# Patient Record
Sex: Female | Born: 1955 | Race: White | Hispanic: No | Marital: Married | Smoking: Never smoker
Health system: Southern US, Community
[De-identification: ages and names within clinical notes are randomized; demographics above are authoritative.]

---

## 1998-04-28 ENCOUNTER — Other Ambulatory Visit: Admission: RE | Admit: 1998-04-28 | Discharge: 1998-04-28 | Payer: Self-pay | Admitting: *Deleted

## 1999-08-03 ENCOUNTER — Encounter: Admission: RE | Admit: 1999-08-03 | Discharge: 1999-08-03 | Payer: Self-pay | Admitting: *Deleted

## 1999-08-14 ENCOUNTER — Other Ambulatory Visit: Admission: RE | Admit: 1999-08-14 | Discharge: 1999-08-14 | Payer: Self-pay | Admitting: *Deleted

## 2001-01-14 ENCOUNTER — Other Ambulatory Visit: Admission: RE | Admit: 2001-01-14 | Discharge: 2001-01-14 | Payer: Self-pay | Admitting: *Deleted

## 2001-01-30 ENCOUNTER — Encounter: Admission: RE | Admit: 2001-01-30 | Discharge: 2001-01-30 | Payer: Self-pay | Admitting: *Deleted

## 2001-02-06 ENCOUNTER — Encounter: Admission: RE | Admit: 2001-02-06 | Discharge: 2001-02-06 | Payer: Self-pay | Admitting: *Deleted

## 2002-01-26 ENCOUNTER — Other Ambulatory Visit: Admission: RE | Admit: 2002-01-26 | Discharge: 2002-01-26 | Payer: Self-pay | Admitting: *Deleted

## 2003-02-10 ENCOUNTER — Other Ambulatory Visit: Admission: RE | Admit: 2003-02-10 | Discharge: 2003-02-10 | Payer: Self-pay | Admitting: *Deleted

## 2005-01-29 ENCOUNTER — Ambulatory Visit: Payer: Self-pay | Admitting: General Practice

## 2006-02-12 ENCOUNTER — Ambulatory Visit: Payer: Self-pay | Admitting: General Practice

## 2007-02-17 ENCOUNTER — Ambulatory Visit: Payer: Self-pay | Admitting: General Practice

## 2008-02-23 ENCOUNTER — Ambulatory Visit: Payer: Self-pay | Admitting: General Practice

## 2009-02-28 ENCOUNTER — Ambulatory Visit: Payer: Self-pay | Admitting: General Practice

## 2009-03-03 ENCOUNTER — Ambulatory Visit: Payer: Self-pay | Admitting: General Practice

## 2010-02-20 ENCOUNTER — Ambulatory Visit: Payer: Self-pay | Admitting: General Practice

## 2011-02-19 ENCOUNTER — Ambulatory Visit: Payer: Self-pay

## 2011-02-26 ENCOUNTER — Ambulatory Visit: Payer: Self-pay | Admitting: General Practice

## 2012-02-18 ENCOUNTER — Ambulatory Visit: Payer: Self-pay

## 2012-02-21 ENCOUNTER — Ambulatory Visit: Payer: Self-pay

## 2013-02-24 ENCOUNTER — Ambulatory Visit: Payer: Self-pay | Admitting: General Practice

## 2014-02-22 ENCOUNTER — Ambulatory Visit: Payer: Self-pay | Admitting: General Practice

## 2014-03-07 ENCOUNTER — Ambulatory Visit: Payer: Self-pay | Admitting: General Practice

## 2015-01-27 ENCOUNTER — Other Ambulatory Visit: Payer: Self-pay | Admitting: Family Medicine

## 2015-01-27 ENCOUNTER — Other Ambulatory Visit: Payer: Self-pay

## 2015-01-28 LAB — CMP12+LP+TP+TSH+6AC+CBC/D/PLT
ALT: 30 IU/L (ref 0–32)
AST: 25 IU/L (ref 0–40)
Albumin/Globulin Ratio: 1.6 (ref 1.1–2.5)
Albumin: 4.2 g/dL (ref 3.5–5.5)
Alkaline Phosphatase: 111 IU/L (ref 39–117)
BUN/Creatinine Ratio: 13 (ref 9–23)
BUN: 9 mg/dL (ref 6–24)
Basophils Absolute: 0.1 10*3/uL (ref 0.0–0.2)
Basos: 1 %
Bilirubin Total: 0.5 mg/dL (ref 0.0–1.2)
Calcium: 8.9 mg/dL (ref 8.7–10.2)
Chloride: 104 mmol/L (ref 97–108)
Chol/HDL Ratio: 4.7 ratio units — ABNORMAL HIGH (ref 0.0–4.4)
Cholesterol, Total: 173 mg/dL (ref 100–199)
Creatinine, Ser: 0.69 mg/dL (ref 0.57–1.00)
EOS (ABSOLUTE): 0.2 10*3/uL (ref 0.0–0.4)
Eos: 2 %
Estimated CHD Risk: 1.2 times avg. — ABNORMAL HIGH (ref 0.0–1.0)
Free Thyroxine Index: 2.2 (ref 1.2–4.9)
GFR calc Af Amer: 112 mL/min/{1.73_m2} (ref >59)
GFR calc non Af Amer: 97 mL/min/{1.73_m2} (ref >59)
GGT: 36 IU/L (ref 0–60)
Globulin, Total: 2.6 g/dL (ref 1.5–4.5)
Glucose: 100 mg/dL — ABNORMAL HIGH (ref 65–99)
HDL: 37 mg/dL — ABNORMAL LOW (ref >39)
Hematocrit: 40.4 % (ref 34.0–46.6)
Hemoglobin: 13.7 g/dL (ref 11.1–15.9)
Immature Grans (Abs): 0 10*3/uL (ref 0.0–0.1)
Immature Granulocytes: 0 %
Iron: 86 ug/dL (ref 27–159)
LDH: 193 IU/L (ref 119–226)
LDL Calculated: 115 mg/dL — ABNORMAL HIGH (ref 0–99)
Lymphocytes Absolute: 2.7 10*3/uL (ref 0.7–3.1)
Lymphs: 37 %
MCH: 31.6 pg (ref 26.6–33.0)
MCHC: 33.9 g/dL (ref 31.5–35.7)
MCV: 93 fL (ref 79–97)
Monocytes Absolute: 0.6 10*3/uL (ref 0.1–0.9)
Monocytes: 8 %
Neutrophils Absolute: 3.8 10*3/uL (ref 1.4–7.0)
Neutrophils: 52 %
Phosphorus: 3 mg/dL (ref 2.5–4.5)
Platelets: 320 10*3/uL (ref 150–379)
Potassium: 4.3 mmol/L (ref 3.5–5.2)
RBC: 4.34 x10E6/uL (ref 3.77–5.28)
RDW: 13.5 % (ref 12.3–15.4)
Sodium: 140 mmol/L (ref 134–144)
T3 Uptake Ratio: 26 % (ref 24–39)
T4, Total: 8.6 ug/dL (ref 4.5–12.0)
TSH: 1.08 u[IU]/mL (ref 0.450–4.500)
Total Protein: 6.8 g/dL (ref 6.0–8.5)
Triglycerides: 105 mg/dL (ref 0–149)
Uric Acid: 5.2 mg/dL (ref 2.5–7.1)
VLDL Cholesterol Cal: 21 mg/dL (ref 5–40)
WBC: 7.4 10*3/uL (ref 3.4–10.8)

## 2015-01-28 LAB — HGB A1C W/O EAG: Hgb A1c MFr Bld: 5.7 % — ABNORMAL HIGH (ref 4.8–5.6)

## 2015-12-26 ENCOUNTER — Other Ambulatory Visit: Payer: Self-pay | Admitting: Family Medicine

## 2015-12-27 LAB — CMP12+LP+TP+TSH+6AC+CBC/D/PLT
ALK PHOS: 185 IU/L — AB (ref 39–117)
ALT: 532 IU/L (ref 0–32)
AST: 261 IU/L — AB (ref 0–40)
Albumin/Globulin Ratio: 1.4 (ref 1.2–2.2)
Albumin: 4.5 g/dL (ref 3.5–5.5)
BASOS: 1 %
BILIRUBIN TOTAL: 0.8 mg/dL (ref 0.0–1.2)
BUN / CREAT RATIO: 15 (ref 9–23)
BUN: 11 mg/dL (ref 6–24)
Basophils Absolute: 0 10*3/uL (ref 0.0–0.2)
CHLORIDE: 102 mmol/L (ref 96–106)
CREATININE: 0.72 mg/dL (ref 0.57–1.00)
Calcium: 9.1 mg/dL (ref 8.7–10.2)
Chol/HDL Ratio: 4.3 ratio units (ref 0.0–4.4)
Cholesterol, Total: 192 mg/dL (ref 100–199)
EOS (ABSOLUTE): 0.2 10*3/uL (ref 0.0–0.4)
EOS: 3 %
ESTIMATED CHD RISK: 1 times avg. (ref 0.0–1.0)
Free Thyroxine Index: 1.6 (ref 1.2–4.9)
GFR calc Af Amer: 107 mL/min/{1.73_m2} (ref >59)
GFR, EST NON AFRICAN AMERICAN: 93 mL/min/{1.73_m2} (ref >59)
GGT: 529 IU/L — ABNORMAL HIGH (ref 0–60)
GLUCOSE: 89 mg/dL (ref 65–99)
Globulin, Total: 3.3 g/dL (ref 1.5–4.5)
HDL: 45 mg/dL (ref >39)
HEMATOCRIT: 45.4 % (ref 34.0–46.6)
HEMOGLOBIN: 15.2 g/dL (ref 11.1–15.9)
IRON: 227 ug/dL — AB (ref 27–159)
Immature Grans (Abs): 0 10*3/uL (ref 0.0–0.1)
Immature Granulocytes: 0 %
LDH: 314 IU/L — ABNORMAL HIGH (ref 119–226)
LDL CALC: 116 mg/dL — AB (ref 0–99)
LYMPHS ABS: 2.4 10*3/uL (ref 0.7–3.1)
Lymphs: 31 %
MCH: 31 pg (ref 26.6–33.0)
MCHC: 33.5 g/dL (ref 31.5–35.7)
MCV: 93 fL (ref 79–97)
Monocytes Absolute: 0.4 10*3/uL (ref 0.1–0.9)
Monocytes: 6 %
Neutrophils Absolute: 4.6 10*3/uL (ref 1.4–7.0)
Neutrophils: 59 %
Phosphorus: 2.8 mg/dL (ref 2.5–4.5)
Platelets: 371 10*3/uL (ref 150–379)
Potassium: 4.3 mmol/L (ref 3.5–5.2)
RBC: 4.9 x10E6/uL (ref 3.77–5.28)
RDW: 13.4 % (ref 12.3–15.4)
SODIUM: 140 mmol/L (ref 134–144)
T3 Uptake Ratio: 22 % — ABNORMAL LOW (ref 24–39)
T4, Total: 7.4 ug/dL (ref 4.5–12.0)
TSH: 1.65 u[IU]/mL (ref 0.450–4.500)
Total Protein: 7.8 g/dL (ref 6.0–8.5)
Triglycerides: 157 mg/dL — ABNORMAL HIGH (ref 0–149)
URIC ACID: 5 mg/dL (ref 2.5–7.1)
VLDL CHOLESTEROL CAL: 31 mg/dL (ref 5–40)
WBC: 7.7 10*3/uL (ref 3.4–10.8)

## 2015-12-27 LAB — HGB A1C W/O EAG: HEMOGLOBIN A1C: 5.4 % (ref 4.8–5.6)

## 2015-12-27 LAB — VITAMIN D 25 HYDROXY (VIT D DEFICIENCY, FRACTURES): VIT D 25 HYDROXY: 27.3 ng/mL — AB (ref 30.0–100.0)

## 2016-01-02 ENCOUNTER — Other Ambulatory Visit: Payer: Self-pay | Admitting: Family Medicine

## 2016-01-03 LAB — HEPATIC FUNCTION PANEL
ALT: 72 IU/L — AB (ref 0–32)
AST: 27 IU/L (ref 0–40)
Albumin: 4.6 g/dL (ref 3.5–5.5)
Alkaline Phosphatase: 140 IU/L — ABNORMAL HIGH (ref 39–117)
BILIRUBIN TOTAL: 0.4 mg/dL (ref 0.0–1.2)
BILIRUBIN, DIRECT: 0.13 mg/dL (ref 0.00–0.40)
Total Protein: 7.7 g/dL (ref 6.0–8.5)

## 2016-01-05 ENCOUNTER — Other Ambulatory Visit: Payer: Self-pay | Admitting: Family Medicine

## 2016-01-05 DIAGNOSIS — R7401 Elevation of levels of liver transaminase levels: Secondary | ICD-10-CM

## 2016-01-05 DIAGNOSIS — R740 Nonspecific elevation of levels of transaminase and lactic acid dehydrogenase [LDH]: Principal | ICD-10-CM

## 2016-01-16 ENCOUNTER — Ambulatory Visit
Admission: RE | Admit: 2016-01-16 | Discharge: 2016-01-16 | Disposition: A | Discharge status: Home / Self Care | Payer: No Typology Code available for payment source | Source: Ambulatory Visit | Attending: Family Medicine | Admitting: Family Medicine

## 2016-01-16 DIAGNOSIS — R740 Nonspecific elevation of levels of transaminase and lactic acid dehydrogenase [LDH]: Principal | ICD-10-CM

## 2016-01-16 DIAGNOSIS — R7401 Elevation of levels of liver transaminase levels: Secondary | ICD-10-CM

## 2016-02-20 IMAGING — MG MAM DGTL ADD VW RT SCR
5 series · 5 of 5 positions shown · non-contrast
Comparison: Prior exams

CLINICAL DATA: Call back from screening for bilateral breast
abnormalities. Possible mass noted on the right with calcifications
noted on the left.

EXAM:
DIGITAL DIAGNOSTIC  BILATERAL MAMMOGRAM
ULTRASOUND BILATERAL BREAST

[L ML]
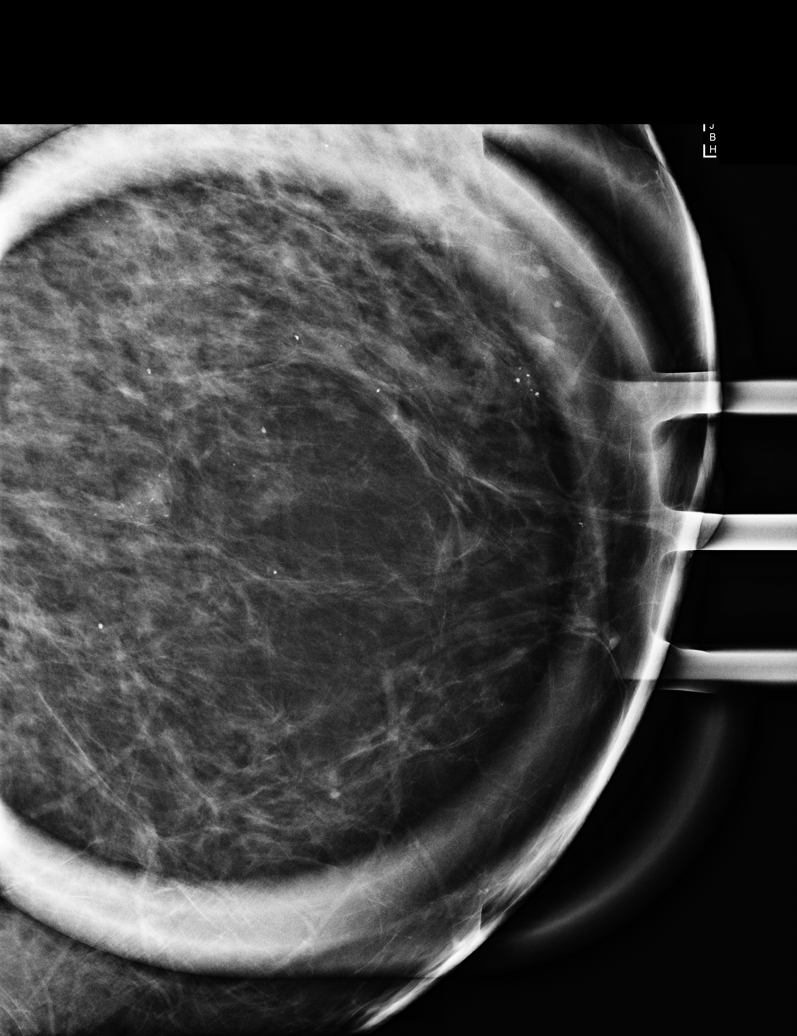

[R MLO]
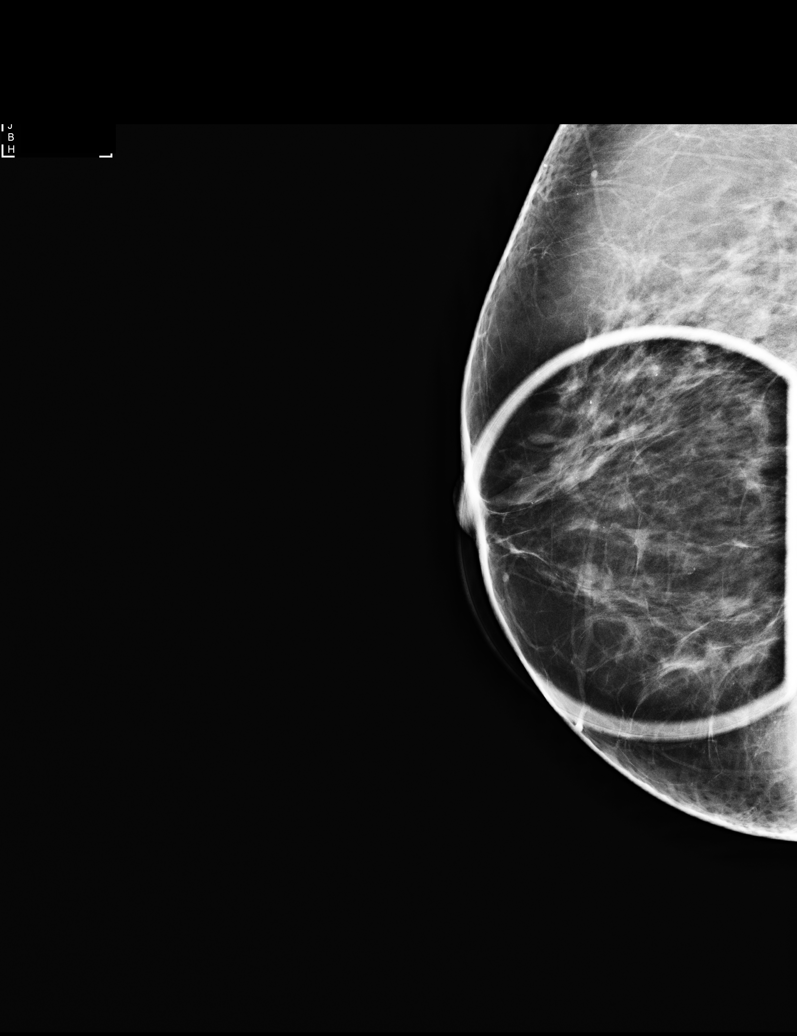

[R CC (1 of 2)]
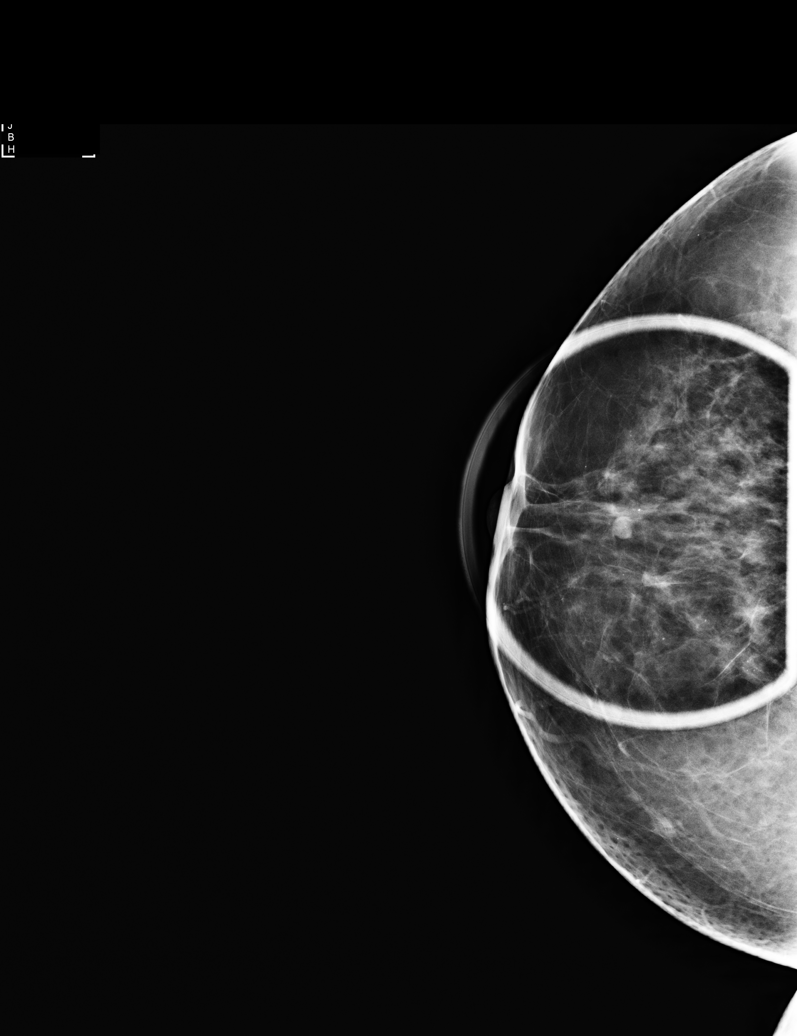

[R CC (2 of 2)]
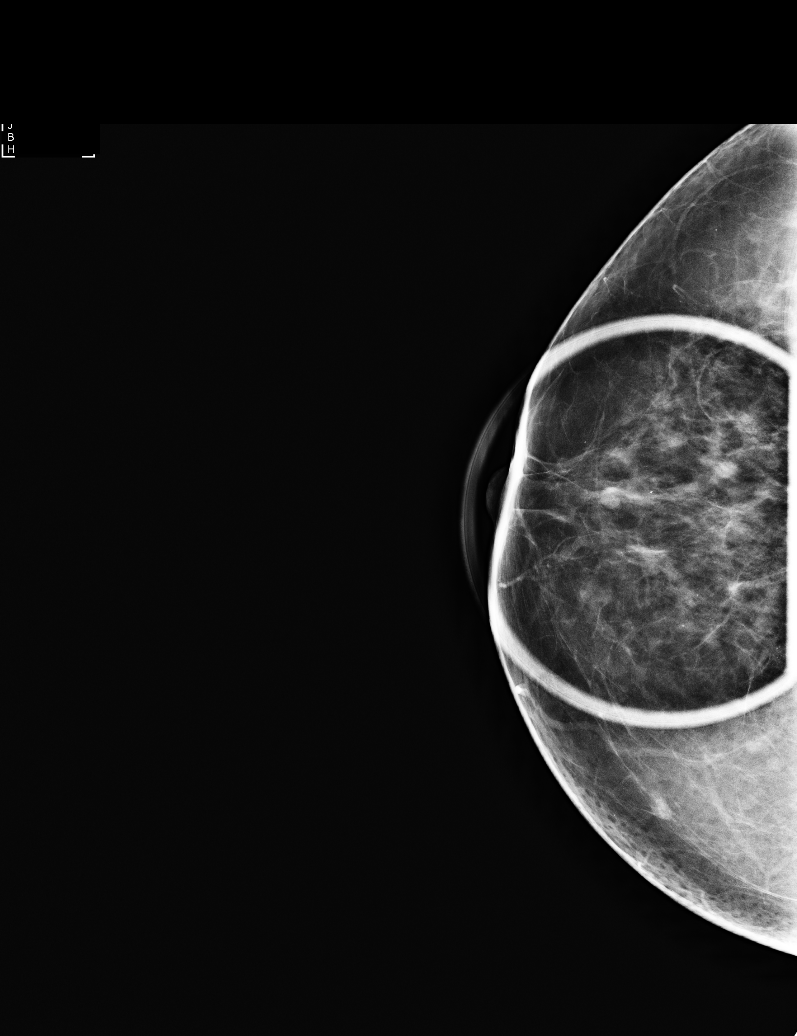

[L CC]
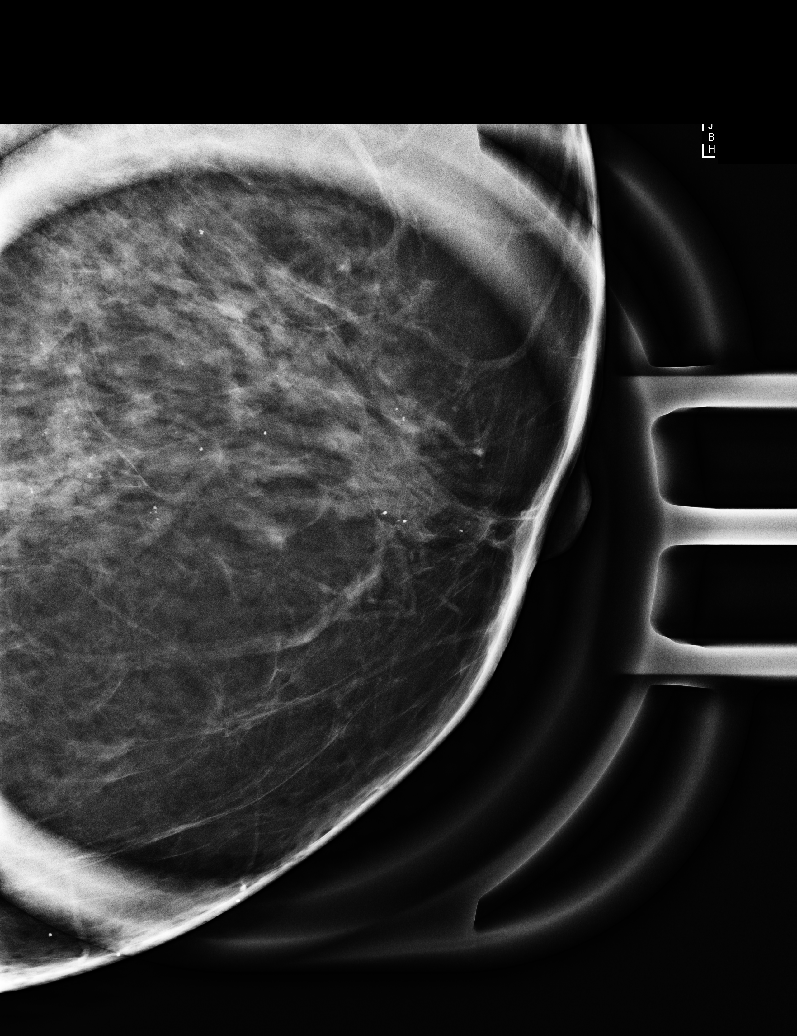

[5 of 5 positions shown; findings below may reference images not displayed]

ACR Breast Density Category b: There are scattered areas of
fibroglandular density.
FINDINGS: On the right, there is a small mass with a possible associated
peripheral calcification. This may be new from prior studies. It
persists on spot compression imaging.

On the left, mildly pleomorphic calcifications are noted medially,
which appear increased from the prior study. There is no linearity
or branching. There is focal associated opacity but no defined mass.

Ultrasound is performed on the right, showing small cyst measuring
4.6 mm x 3.5 mm x 4.2 mm. This is consistent with the mammographic
abnormality.

Sonographic evaluation of the left breast, throughout the medial
left breast, shows no masses or suspicious findings.
IMPRESSION: 1. Benign right breast cyst. No evidence of right breast malignancy.
2. Probably benign left breast calcifications. Short-term follow-up
recommended.

RECOMMENDATION:
1. Short-term followup with repeat left breast diagnostic
mammography in 6 months.
2. Routine yearly screening mammography for the right breast

I have discussed the findings and recommendations with the patient.
Results were also provided in writing at the conclusion of the
visit. If applicable, a reminder letter will be sent to the patient
regarding the next appointment.

BI-RADS CATEGORY  3: Probably benign.

## 2016-09-12 ENCOUNTER — Ambulatory Visit: Payer: Self-pay | Admitting: *Deleted

## 2016-09-12 DIAGNOSIS — S91311S Laceration without foreign body, right foot, sequela: Secondary | ICD-10-CM

## 2016-09-12 NOTE — Progress Notes (Signed)
Pt has laceration to top of R foot. Cut it on a ceramic planter. Seen in [place] ED. Received sutures and a Tdap vaccine. Refused work note from ED. Cannot fit foot in shoe today 2/2 swelling and tenderness. 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 Requests wound cleaning and redressing as she was instructed to clean it daily.  Cleaned with first aid antiseptic. Triple antibiotic ointment placed on site. Covered with telfa, gauze and foot wrapped in [place]. Supplies for next few days of dressing changes provided to pt. Has appt with PCP tomorrow for evaluation and work notes. Plans to return to work Monday. Pt reports no questions or concerns.

## 2016-11-26 ENCOUNTER — Ambulatory Visit: Payer: Self-pay | Admitting: Registered Nurse

## 2016-11-26 VITALS — BP 124/82 | HR 64 | Temp 97.3°F

## 2016-11-26 DIAGNOSIS — H6593 Unspecified nonsuppurative otitis media, bilateral: Secondary | ICD-10-CM

## 2016-11-26 DIAGNOSIS — H1031 Unspecified acute conjunctivitis, right eye: Secondary | ICD-10-CM

## 2016-11-26 DIAGNOSIS — H1013 Acute atopic conjunctivitis, bilateral: Secondary | ICD-10-CM

## 2016-11-26 MED ORDER — ERYTHROMYCIN 5 MG/GM OP OINT: 1.0000 "application " | TOPICAL_OINTMENT | Freq: Four times a day (QID) | OPHTHALMIC | 0 refills | Status: AC

## 2016-11-26 MED ORDER — CARBOXYMETHYLCELLULOSE SODIUM 1 % OP SOLN: 1.0000 [drp] | Freq: Three times a day (TID) | OPHTHALMIC | 12 refills | Status: DC

## 2016-11-26 MED ORDER — KETOTIFEN FUMARATE 0.025 % OP SOLN: 1.0000 [drp] | Freq: Two times a day (BID) | OPHTHALMIC | 0 refills | Status: DC

## 2016-11-26 NOTE — Patient Instructions (Signed)
Bacterial Conjunctivitis Bacterial conjunctivitis is an infection of the clear membrane that covers the white part of your eye and the inner surface of your eyelid (conjunctiva). When the blood vessels in your conjunctiva become inflamed, your eye becomes red or pink, and it will probably feel itchy. Bacterial conjunctivitis spreads very easily from person to person (is contagious). It also spreads easily from one eye to the other eye. What are the causes? This condition is caused by several common bacteria. You may get the infection if you come into close contact with another person who is infected. You may also come into contact with items that are contaminated with the bacteria, such as a face towel, contact lens solution, or eye makeup. What increases the risk? This condition is more likely to develop in people who:  Are exposed to other people who have the infection.  Wear contact lenses.  Have a sinus infection.  Have had a recent eye injury or surgery.  Have a weak body defense system (immune system).  Have a medical condition that causes dry eyes.  What are the signs or symptoms? Symptoms of this condition include:  Eye redness.  Tearing or watery eyes.  Itchy eyes.  Burning feeling in your eyes.  Thick, yellowish discharge from an eye. This may turn into a crust on the eyelid overnight and cause your eyelids to stick together.  Swollen eyelids.  Blurred vision.  How is this diagnosed? Your health care provider can diagnose this condition based on your symptoms and medical history. Your health care provider may also take a sample of discharge from your eye to find the cause of your infection. This is rarely done. How is this treated? Treatment for this condition includes:  Antibiotic eye drops or ointment to clear the infection more quickly and prevent the spread of infection to others.  Oral antibiotic medicines to treat infections that do not respond to drops or  ointments, or last longer than 10 days.  Cool, wet cloths (cool compresses) placed on the eyes.  Artificial tears applied 2-6 times a day.  Follow these instructions at home: Medicines  Take or apply your antibiotic medicine as told by your health care provider. Do not stop taking or applying the antibiotic even if you start to feel better.  Take or apply over-the-counter and prescription medicines only as told by your health care provider.  Be very careful to avoid touching the edge of your eyelid with the eye drop bottle or the ointment tube when you apply medicines to the affected eye. This will keep you from spreading the infection to your other eye or to other people. Managing discomfort  Gently wipe away any drainage from your eye with a warm, wet washcloth or a cotton ball.  Apply a cool, clean washcloth to your eye for 10-20 minutes, 3-4 times a day. General instructions  Do not wear contact lenses until the inflammation is gone and your health care provider says it is safe to wear them again. Ask your health care provider how to sterilize or replace your contact lenses before you use them again. Wear glasses until you can resume wearing contacts.  Avoid wearing eye makeup until the inflammation is gone. Throw away any old eye cosmetics that may be contaminated.  Change or wash your pillowcase every day.  Do not share towels or washcloths. This may spread the infection.  Wash your hands often with soap and water. Use paper towels to dry your hands.  Avoid   touching or rubbing your eyes.  Do not drive or use heavy machinery if your vision is blurred. Contact a health care provider if:  You have a fever.  Your symptoms do not get better after 10 days. Get help right away if:  You have a fever and your symptoms suddenly get worse.  You have severe pain when you move your eye.  You have facial pain, redness, or swelling.  You have sudden loss of vision. This  information is not intended to replace advice given to you by your health care provider. Make sure you discuss any questions you have with your health care provider. Document Released: 04/15/2005 Document Revised: 08/24/2015 Document Reviewed: 01/26/2015 Elsevier Interactive Patient Education  2017 [employer] Allergic Conjunctivitis, Adult Allergic conjunctivitis is inflammation of the clear membrane that covers the white part of your eye and the inner surface of your eyelid (conjunctiva). The inflammation is caused by allergies. The blood vessels in the conjunctiva become inflamed and this causes the eyes to become red or pink. The eyes often feel itchy. Allergic conjunctivitis cannot be spread from one person to another person (is not contagious). What are the causes? This condition is caused by an allergic reaction. Common causes of an allergic reaction (allergens) include:  Outdoor allergens, such as: ? Pollen. ? Grass and weeds. ? Mold spores.  Indoor allergens, such as: ? Dust. ? Smoke. ? Mold. ? Pet dander. ? Animal hair.  What increases the risk? You may be more likely to develop this condition if you have a family history of allergies, such as:  Allergic rhinitis.  Bronchial asthma.  Atopic dermatitis.  What are the signs or symptoms? Symptoms of this condition include eyes that are:  Itchy.  Red.  Watery.  Puffy.  Your eyes may also:  Sting or burn.  Have clear drainage coming from them.  How is this diagnosed? This condition may be diagnosed by medical history and physical exam. If you have drainage from your eyes, it may be tested to rule out other causes of conjunctivitis. You may also need to see a health care provider who specializes in treating allergies (allergist) or eye conditions (ophthalmologist) for tests to confirm the diagnosis. You may have:  Skin tests to see which allergens are causing your symptoms. These tests involve pricking the skin  with a tiny needle and exposing the skin to small amounts of potential allergens to see if your skin reacts.  Blood tests.  Tissue scrapings from your eyelid. These will be examined under a microscope.  How is this treated? Treatments for this condition may include:  Cold cloths (compresses) to soothe itching and swelling.  Washing the face to remove allergens.  Eye drops. These may be prescription or over-the-counter. There are several different types. You may need to try different types to see which one works best for you. Your may need: ? Eye drops that block the allergic reaction (antihistamine). ? Eye drops that reduce swelling and irritation (anti-inflammatory). ? Steroid eye drops to lessen a severe reaction (vernal conjunctivitis).  Oral antihistamine medicines to reduce your allergic reaction. You may need these if eye drops do not help or are difficult to use.  Follow these instructions at home:  Avoid known allergens whenever possible.  Take or apply over-the-counter and prescription medicines only as told by your health care provider. These include any eye drops.  Apply a cool, clean washcloth to your eye for 10-20 minutes, 3-4 times a day.  Do  not touch or rub your eyes.  Do not wear contact lenses until the inflammation is gone. Wear glasses instead.  Do not wear eye makeup until the inflammation is gone.  Keep all follow-up visits as told by your health care provider. This is important. Contact a health care provider if:  Your symptoms get worse or do not improve with treatment.  You have mild eye pain.  You have sensitivity to light.  You have spots or blisters on your eyes.  You have pus draining from your eye.  You have a fever. Get help right away if:  You have redness, swelling, or other symptoms in only one eye.  Your vision is blurred or you have vision changes.  You have severe eye pain. This information is not intended to replace advice  given to you by your health care provider. Make sure you discuss any questions you have with your health care provider. Document Released: 07/06/2002 Document Revised: 12/13/2015 Document Reviewed: 10/27/2015 Elsevier Interactive Patient Education  2018 [employer]

## 2016-11-26 NOTE — Progress Notes (Signed)
Subjective:    Patient ID: Teresa Mccormick, female    DOB: 01-Dec-1955, 61 y.o.   MRN: [id number]  61y/o caucasian female established Pt c/o L eye swelling, pain, pressure since yesterday. TTP below eye. Denies redness, foreign body sensation, discharge/ irritation of eye. Denies vision changes e.g. Blurred vision, loss of field, headache, nausea, vomiting.  Allergies typically do not bother her denied sick contacts or symptoms similar to this in the past.  Has established relationship with optometrist.  Does not wear contacts.      Review of Systems  Constitutional: Negative for activity change, appetite change, chills, diaphoresis, fatigue, fever and unexpected weight change.  HENT: Negative for congestion, dental problem, drooling, ear discharge, ear pain, facial swelling, hearing loss, mouth sores, nosebleeds, postnasal drip, rhinorrhea, sinus pain, sinus pressure, sneezing, sore throat, tinnitus, trouble swallowing and voice change.   Eyes: Positive for pain. Negative for photophobia, discharge, redness, itching and visual disturbance.  Respiratory: Negative for cough, choking, chest tightness, shortness of breath, wheezing and stridor.   Cardiovascular: Negative for chest pain, palpitations and leg swelling.  Gastrointestinal: Negative for abdominal distention, abdominal pain, blood in stool, constipation, diarrhea, nausea and vomiting.  Endocrine: Negative for cold intolerance and heat intolerance.  Genitourinary: Negative for difficulty urinating, dysuria and hematuria.  Musculoskeletal: Negative for arthralgias, back pain, gait problem, joint swelling, myalgias, neck pain and neck stiffness.  Skin: Negative for color change, pallor, rash and wound.  Allergic/Immunologic: Negative for environmental allergies and food allergies.  Neurological: Negative for dizziness, tremors, seizures, syncope, facial asymmetry, speech difficulty, weakness, light-headedness, numbness and headaches.   Hematological: Negative for adenopathy. Does not bruise/bleed easily.  Psychiatric/Behavioral: Negative for agitation, behavioral problems, confusion and sleep disturbance.       Objective:   Physical Exam  Constitutional: She is oriented to person, place, and time. Vital signs are normal. She appears well-developed and well-nourished. She is active and cooperative.  Non-toxic appearance. She does not have a sickly appearance. She appears ill. No distress.  HENT:  Head: Normocephalic and atraumatic. Head is without raccoon's eyes, without Battle's sign, without abrasion, without contusion, without right periorbital erythema and without left periorbital erythema.  Right Ear: Hearing, external ear and ear canal normal. A middle ear effusion is present.  Left Ear: Hearing, external ear and ear canal normal. A middle ear effusion is present.  Nose: Mucosal edema and rhinorrhea present. No nose lacerations, sinus tenderness, nasal deformity, septal deviation or nasal septal hematoma. No epistaxis.  No foreign bodies. Right sinus exhibits no maxillary sinus tenderness and no frontal sinus tenderness. Left sinus exhibits no maxillary sinus tenderness and no frontal sinus tenderness.  Mouth/Throat: Uvula is midline and mucous membranes are normal. Mucous membranes are not pale, not dry and not cyanotic. She does not have dentures. No oral lesions. No trismus in the jaw. Normal dentition. No dental abscesses, uvula swelling, lacerations or dental caries. Posterior oropharyngeal edema and posterior oropharyngeal erythema present. No oropharyngeal exudate or tonsillar abscesses.  Cobblestoning posterior pharynx; bilateral TMs air fluid level clear; mild allergic shiners bilaterally  Eyes: Pupils are equal, round, and reactive to light. EOM and lids are normal. Right eye exhibits no chemosis, no discharge, no exudate and no hordeolum. No foreign body present in the right eye. Left eye exhibits no chemosis, no  discharge, no exudate and no hordeolum. No foreign body present in the left eye. Right conjunctiva is injected. Right conjunctiva has no hemorrhage. Left conjunctiva is injected. Left conjunctiva has  no hemorrhage. No scleral icterus. Right eye exhibits normal extraocular motion and no nystagmus. Left eye exhibits normal extraocular motion and no nystagmus. Right pupil is round and reactive. Left pupil is round and reactive. Pupils are equal.    Nonpitting 1+/4 lower eyelid edema right; bilateral 1-2+/4 eyelid injection upper and lower; bulbar injection 0-1+/4 bilaterally no vascular excoriation noted; snellen 20/20 ou DVA without correction  Neck: Trachea normal, normal range of motion and phonation normal. Neck supple. No tracheal tenderness, no spinous process tenderness and no muscular tenderness present. No neck rigidity. No tracheal deviation, no edema, no erythema and normal range of motion present. No thyroid mass and no thyromegaly present.  Cardiovascular: Normal rate, regular rhythm, S1 normal, S2 normal, normal heart sounds and intact distal pulses.  PMI is not displaced.  Exam reveals no gallop and no friction rub.   No murmur heard. Pulmonary/Chest: Effort normal and breath sounds normal. No accessory muscle usage or stridor. No respiratory distress. She has no decreased breath sounds. She has no wheezes. She has no rhonchi. She has no rales. She exhibits no tenderness.  Abdominal: Soft. Normal appearance. She exhibits no distension, no fluid wave and no ascites. There is no rigidity and no guarding.  Musculoskeletal: Normal range of motion. She exhibits no edema or tenderness.       Right shoulder: Normal.       Left shoulder: Normal.       Right hip: Normal.       Left hip: Normal.       Right knee: Normal.       Left knee: Normal.       Cervical back: Normal.       Right hand: Normal.       Left hand: Normal.  Lymphadenopathy:       Head (right side): No submental, no  submandibular, no tonsillar, no preauricular, no posterior auricular and no occipital adenopathy present.       Head (left side): No submental, no submandibular, no tonsillar, no preauricular, no posterior auricular and no occipital adenopathy present.    She has no cervical adenopathy.       Right cervical: No superficial cervical, no deep cervical and no posterior cervical adenopathy present.      Left cervical: No superficial cervical, no deep cervical and no posterior cervical adenopathy present.  Neurological: She is alert and oriented to person, place, and time. She has normal strength. She is not disoriented. She displays no atrophy and no tremor. No cranial nerve deficit or sensory deficit. She exhibits normal muscle tone. She displays no seizure activity. Coordination and gait normal. GCS eye subscore is 4. GCS verbal subscore is 5. GCS motor subscore is 6.  Bilateral hand grasp 5/5; on/off exam table/ in/out of chair without difficulty; gait sure and steady in hallway  Skin: Skin is warm, dry and intact. No abrasion, no bruising, no burn, no ecchymosis, no laceration, no lesion, no petechiae and no rash noted. She is not diaphoretic. No cyanosis or erythema. No pallor. Nails show no clubbing.  Psychiatric: She has a normal mood and affect. Her speech is normal and behavior is normal. Judgment and thought content normal. Cognition and memory are normal.  Nursing note and vitals reviewed.         Assessment & Plan:  A-right bacterial conjunctivitis; bilateral allergic conjunctivitis and rhinitis, bilateral otitis media with effusion  P-Start antihistamine medication oral and or drops.  Rx ketotifen opthalmic 1 gtt ou  BID x 30 days #1 Rf0 Given 5 UD refresh gtts to use at work 1-2 gtts ou TID prn eye irritation.  Shower after allergen exposure prior to bed.  Hygiene discussed.  Patient to apply warm packs prn as directed.  Instructed patient to not rub eyes.  May use over the counter eye  drops/tears for pain/symptom relief.  Return to clinic if headache, fever greater than 100.17F, nausea/vomiting, purulent discharge/matting unable to open eye without using fingers, foreign body sensation, ciliary flush, worsening photophobia or vision.  Call or return to clinic as needed if these symptoms worsen or fail to improve as anticipated.  Patient given [employer] handout on allergic conjunctivitis.  Patient verbalized agreement and understanding of treatment plan.   P2:  Hand washing, avoid rubbing face/eyes  Cleared for work  Presenter, broadcastingHygiene discussed. . Patient to apply warm packs prn as directed.  Instructed patient to not rub eyes.  May need to wash pillowcases more frequently until infection resolves.  May use over the counter eye drops/tears for pain/symptom relief.  Return to clinic if headache, fever greater than 100.17F, nausea/vomiting, purulent discharge/matting unable to open eye without using fingers, foreign body sensation, ciliary flush, worsening photophobia or vision.  Call or return to clinic as needed if these symptoms worsen or fail to improve as anticipated.  Patient given [employer] handout on viral conjunctivitis.  Patient verbalized agreement and understanding of treatment plan.   P2:  Hand washing, avoid rubbing face/eyes  Start erythromycin opthalmic 1cm right eye QID x 7 days #1 RF0 Rx given.  Refresh gtts ou TID 1-2 x 30 days.  Cleared for work.Hygiene discussed.  Patient to apply warm packs prn as directed.  Instructed patient to not rub eyes.  May need to wash pillowcases more frequently until infection resolves.  May use over the counter eye drops/tears for pain/symptom relief.  Return to clinic if headache, fever greater than 100.17F, nausea/vomiting, purulent discharge/matting unable to open eye without using fingers after 24 hours of medication use, foreign body sensation, ciliary flush, worsening photophobia or vision.  Call or return to clinic as needed if these symptoms worsen  or fail to improve as anticipated.  Patient given [employer] handout on bacterial conjunctivitis.  Patient verbalized agreement and understanding of treatment plan.   P2:  Hand washing, avoid rubbing face/eyes

## 2016-12-19 ENCOUNTER — Ambulatory Visit: Payer: Self-pay | Admitting: *Deleted

## 2016-12-19 VITALS — BP 133/88 | HR 74 | Ht 62.0 in | Wt 170.0 lb

## 2016-12-19 DIAGNOSIS — Z0000 Encounter for general adult medical examination without abnormal findings: Secondary | ICD-10-CM

## 2016-12-19 NOTE — Progress Notes (Signed)
[employer] insurance premium discount evaluation: Labs Drawn. Replacements ROI form signed. Tobacco Free Attestation form signed.  Forms placed in paper chart.  Okay to route results to PCP.   

## 2016-12-20 LAB — CMP12+LP+TP+TSH+6AC+CBC/D/PLT
A/G RATIO: 1.5 (ref 1.2–2.2)
ALK PHOS: 116 IU/L (ref 39–117)
ALT: 24 IU/L (ref 0–32)
AST: 27 IU/L (ref 0–40)
Albumin: 4.6 g/dL (ref 3.5–5.5)
BASOS ABS: 0.1 10*3/uL (ref 0.0–0.2)
BUN/Creatinine Ratio: 14 (ref 9–23)
BUN: 11 mg/dL (ref 6–24)
Basos: 1 %
Bilirubin Total: 0.6 mg/dL (ref 0.0–1.2)
CHOL/HDL RATIO: 4.9 ratio — AB (ref 0.0–4.4)
CHOLESTEROL TOTAL: 204 mg/dL — AB (ref 100–199)
Calcium: 9.6 mg/dL (ref 8.7–10.2)
Chloride: 103 mmol/L (ref 96–106)
Creatinine, Ser: 0.81 mg/dL (ref 0.57–1.00)
EOS (ABSOLUTE): 0.2 10*3/uL (ref 0.0–0.4)
Eos: 2 %
Estimated CHD Risk: 1.3 times avg. — ABNORMAL HIGH (ref 0.0–1.0)
FREE THYROXINE INDEX: 1.8 (ref 1.2–4.9)
GFR calc non Af Amer: 80 mL/min/{1.73_m2} (ref >59)
GFR, EST AFRICAN AMERICAN: 92 mL/min/{1.73_m2} (ref >59)
GGT: 33 IU/L (ref 0–60)
GLOBULIN, TOTAL: 3.1 g/dL (ref 1.5–4.5)
Glucose: 88 mg/dL (ref 65–99)
HDL: 42 mg/dL (ref >39)
Hematocrit: 43.3 % (ref 34.0–46.6)
Hemoglobin: 14.5 g/dL (ref 11.1–15.9)
IMMATURE GRANS (ABS): 0 10*3/uL (ref 0.0–0.1)
IMMATURE GRANULOCYTES: 0 %
Iron: 141 ug/dL (ref 27–159)
LDH: 230 IU/L — AB (ref 119–226)
LDL CALC: 136 mg/dL — AB (ref 0–99)
LYMPHS: 43 %
Lymphocytes Absolute: 3.4 10*3/uL — ABNORMAL HIGH (ref 0.7–3.1)
MCH: 30.9 pg (ref 26.6–33.0)
MCHC: 33.5 g/dL (ref 31.5–35.7)
MCV: 92 fL (ref 79–97)
MONOCYTES: 7 %
MONOS ABS: 0.6 10*3/uL (ref 0.1–0.9)
NEUTROS ABS: 3.8 10*3/uL (ref 1.4–7.0)
NEUTROS PCT: 47 %
POTASSIUM: 4.4 mmol/L (ref 3.5–5.2)
Phosphorus: 3.1 mg/dL (ref 2.5–4.5)
Platelets: 339 10*3/uL (ref 150–379)
RBC: 4.69 x10E6/uL (ref 3.77–5.28)
RDW: 13.5 % (ref 12.3–15.4)
Sodium: 141 mmol/L (ref 134–144)
T3 Uptake Ratio: 22 % — ABNORMAL LOW (ref 24–39)
T4 TOTAL: 8 ug/dL (ref 4.5–12.0)
TRIGLYCERIDES: 132 mg/dL (ref 0–149)
TSH: 1.52 u[IU]/mL (ref 0.450–4.500)
Total Protein: 7.7 g/dL (ref 6.0–8.5)
Uric Acid: 5 mg/dL (ref 2.5–7.1)
VLDL Cholesterol Cal: 26 mg/dL (ref 5–40)
WBC: 8 10*3/uL (ref 3.4–10.8)

## 2016-12-20 LAB — HGB A1C W/O EAG: Hgb A1c MFr Bld: 5.3 % (ref 4.8–5.6)

## 2016-12-20 NOTE — Progress Notes (Signed)
Results reviewed with pt. HFP WNL, Hx fatty liver last year. LDH slightly elevated. Total chol and LDL elevated, slightly worsened from previous. T3 decreased. Other labs unremarkable. Diet and exercise recommendations discussed for BMI and lipid panel. Copy provided to pt. Results routed to pcp per pt request. No further questions/concerns.

## 2017-02-20 ENCOUNTER — Ambulatory Visit: Payer: Self-pay | Admitting: Registered Nurse

## 2017-02-20 VITALS — BP 129/88 | HR 66 | Temp 96.8°F

## 2017-02-20 DIAGNOSIS — H1013 Acute atopic conjunctivitis, bilateral: Secondary | ICD-10-CM

## 2017-02-20 MED ORDER — CARBOXYMETHYLCELLULOSE SODIUM 1 % OP SOLN: 1.0000 [drp] | Freq: Three times a day (TID) | OPHTHALMIC | 12 refills | Status: DC

## 2017-02-20 MED ORDER — CETIRIZINE HCL 10 MG PO TABS: 10.0000 mg | ORAL_TABLET | Freq: Every day | ORAL | 11 refills | Status: DC

## 2017-02-20 MED ORDER — KETOTIFEN FUMARATE 0.025 % OP SOLN: 1.0000 [drp] | Freq: Two times a day (BID) | OPHTHALMIC | 0 refills | Status: DC

## 2017-02-20 MED ORDER — KETOTIFEN FUMARATE 0.025 % OP SOLN: 1.0000 [drp] | Freq: Two times a day (BID) | OPHTHALMIC | 0 refills | Status: AC

## 2017-02-20 NOTE — Progress Notes (Signed)
Subjective:    Patient ID: Teresa GublerJudy B Mccormick, female    DOB: 1956-01-20, 61 y.o.   MRN: [id number]  61y/o caucasian female established Pt reports R eye pain/irritation, foreign body sensation x2 days. Redness, watery drainage. Used a steroid drop from home that she had Rx'd for a previous similar episode and that has helped some.  Wears reading glasses and a different pair for distance.  Some blurriness noted.  Has been working in the yard raking, gardening.  Denied fever/headache/nausea/vomiting/eye swelling.      Review of Systems  Constitutional: Negative for activity change, appetite change, chills, diaphoresis, fatigue and fever.  HENT: Negative for congestion, trouble swallowing and voice change.   Eyes: Positive for pain, discharge, redness, itching and visual disturbance. Negative for photophobia.  Respiratory: Negative for cough.   Cardiovascular: Negative for chest pain, palpitations and leg swelling.  Gastrointestinal: Negative for nausea and vomiting.  Endocrine: Negative for cold intolerance and heat intolerance.  Genitourinary: Negative for difficulty urinating and dysuria.  Musculoskeletal: Negative for neck pain and neck stiffness.  Allergic/Immunologic: Positive for environmental allergies. Negative for food allergies.  Neurological: Negative for dizziness, tremors, seizures, syncope, facial asymmetry, speech difficulty, weakness, light-headedness, numbness and headaches.  Hematological: Negative for adenopathy. Does not bruise/bleed easily.  Psychiatric/Behavioral: Negative for agitation, confusion and sleep disturbance.       Objective:   Physical Exam  Constitutional: She is oriented to person, place, and time. Vital signs are normal. She appears well-developed and well-nourished. She is active and cooperative.  Non-toxic appearance. She does not have a sickly appearance. She does not appear ill. No distress.  HENT:  Head: Normocephalic and atraumatic.  Right Ear:  Hearing and external ear normal.  Left Ear: Hearing and external ear normal.  Nose: Nose normal. No mucosal edema, rhinorrhea, nose lacerations, nasal deformity, septal deviation or nasal septal hematoma. No epistaxis.  No foreign bodies.  Mouth/Throat: Uvula is midline, oropharynx is clear and moist and mucous membranes are normal. Mucous membranes are not pale, not dry and not cyanotic. She does not have dentures. No oral lesions. No trismus in the jaw. Normal dentition. No dental abscesses, uvula swelling, lacerations or dental caries. No oropharyngeal exudate, posterior oropharyngeal edema, posterior oropharyngeal erythema or tonsillar abscesses.  Bilateral allergic shiners  Eyes: Pupils are equal, round, and reactive to light. EOM and lids are normal. Right eye exhibits no chemosis, no discharge, no exudate and no hordeolum. No foreign body present in the right eye. Left eye exhibits no chemosis, no discharge, no exudate and no hordeolum. No foreign body present in the left eye. Right conjunctiva is injected. Right conjunctiva has no hemorrhage. Left conjunctiva is injected. Left conjunctiva has no hemorrhage. No scleral icterus. Right eye exhibits normal extraocular motion and no nystagmus. Left eye exhibits normal extraocular motion and no nystagmus. Right pupil is round and reactive. Left pupil is round and reactive. Pupils are equal.  Right greater than left lower eyelid injection 3+ >2+; snellen distant vision without correction 20/40 ou; 20/40 od and 20/50 os  Neck: Trachea normal, normal range of motion and phonation normal. Neck supple. No tracheal tenderness and no muscular tenderness present. No neck rigidity. No tracheal deviation, no edema, no erythema and normal range of motion present. No thyroid mass and no thyromegaly present.  Cardiovascular: Normal rate, regular rhythm, S1 normal, S2 normal, normal heart sounds and intact distal pulses.  PMI is not displaced.  Exam reveals no gallop  and no friction rub.  No murmur heard. Pulmonary/Chest: Effort normal and breath sounds normal. No accessory muscle usage or stridor. No respiratory distress. She has no decreased breath sounds. She has no wheezes. She has no rhonchi. She has no rales. She exhibits no tenderness.  Abdominal: Soft. Normal appearance. She exhibits no distension, no fluid wave and no ascites. There is no rigidity and no guarding.  Musculoskeletal: Normal range of motion. She exhibits no edema or tenderness.       Right shoulder: Normal.       Left shoulder: Normal.       Right elbow: Normal.      Left elbow: Normal.       Right hip: Normal.       Left hip: Normal.       Right knee: Normal.       Left knee: Normal.       Cervical back: Normal.       Right hand: Normal.       Left hand: Normal.  Lymphadenopathy:       Head (right side): No submental, no submandibular, no tonsillar, no preauricular, no posterior auricular and no occipital adenopathy present.       Head (left side): No submental, no submandibular, no tonsillar, no preauricular, no posterior auricular and no occipital adenopathy present.    She has no cervical adenopathy.       Right cervical: No superficial cervical, no deep cervical and no posterior cervical adenopathy present.      Left cervical: No superficial cervical, no deep cervical and no posterior cervical adenopathy present.  Neurological: She is alert and oriented to person, place, and time. She has normal strength. She is not disoriented. She displays no atrophy and no tremor. No cranial nerve deficit or sensory deficit. She exhibits normal muscle tone. She displays no seizure activity. Coordination and gait normal. GCS eye subscore is 4. GCS verbal subscore is 5. GCS motor subscore is 6.  In/out of chair without difficulty; gait sure and steady in hallway  Skin: Skin is warm, dry and intact. No abrasion, no bruising, no burn, no ecchymosis, no laceration, no lesion, no petechiae and no  rash noted. She is not diaphoretic. No cyanosis or erythema. No pallor. Nails show no clubbing.  Psychiatric: She has a normal mood and affect. Her speech is normal and behavior is normal. Judgment and thought content normal. Cognition and memory are normal.  Nursing note and vitals reviewed.         Assessment & Plan:  A-bilateral allergic conjunctivitis  P-Cleared for work.  Ketotifen 1gtt ou BID #1 RF0, refresh gtts 1 gtt ou TID #1 RF11.  If still itchy eyes start zyrtec 10mg  po daily OTC.   Start antihistamine medication. Shower after allergen exposure prior to bed. Hygiene discussed.  Patient to apply warm packs prn as directed.  Either use paper towels or wash washcloths hot water/bleach just in case viral component present also may need to wash pillowcases more often. Instructed patient to not rub eyes. May use over the counter eye drops/tears for pain/symptom relief. Return to clinic if headache, fever greater than 100.74F, nausea/vomiting, purulent discharge/matting unable to open eye without using fingers, foreign body sensation, ciliary flush, worsening photophobia or vision. Call or return to clinic as needed if these symptoms worsen or fail to improve as anticipated. Patient given Exit[employer] handout on allergic conjunctivitis. Patient verbalized agreement and understanding of treatment plan and had no further questions at this time.  P2: Hand  washing, wear glasses.

## 2017-02-20 NOTE — Patient Instructions (Signed)
Eye Foreign Body A foreign body refers to any object on the surface of the eye or in the eyeball that should not be there. A foreign body may be a small speck of dirt or dust, a hair or eyelash, a splinter, or any other object. What are the signs or symptoms? Symptoms depend on what the foreign body is and where it is in the eye. The most common locations are:  On the inner surface of the upper or lower eyelids or on the covering of the white part of the eye (conjunctiva). Symptoms in this location are: ? Pain and irritation, especially when blinking. ? The feeling that something is in the eye.  On the surface of the clear covering on the front of the eye (cornea). Symptoms in this location include: ? Pain and irritation. ? Small "rust rings" around a metallic foreign body. ? The feeling that something is in the eye.  Inside the eyeball. Foreign bodies inside the eye may cause: ? Great pain. ? Immediate loss of vision. ? Distortion of the pupil.  How is this diagnosed? Foreign bodies are found during an exam by an eye specialist. Those on the eyelids, conjunctiva, or cornea are usually (but not always) easily found. When a foreign body is inside the eyeball, a cloudiness of the lens (cataract) may form almost right away. This makes it hard for an eye specialist to find the foreign body. Tests may be needed, including ultrasound testing, X-rays, and CT scans. How is this treated?  Foreign bodies on the eyelids, conjunctiva, or cornea are often removed easily and painlessly.  Rust in the cornea may require the use of a drill-like instrument to remove the rust.  If the foreign body has caused a scratch or a rubbing or scraping (abrasion) of the cornea, this may be treated with antibiotic drops or ointment. A pressure patch may be put over your eye.  If the foreign body is inside your eyeball, surgery is needed right away. This is a medical emergency. Foreign bodies inside the eye threaten  vision. A person may even lose his or her eye. Follow these instructions at home:  Take medicines only as directed by your health care provider. Use eye drops or ointment as directed.  If no eye patch was applied: ? Keep your eye closed as much as possible. ? Do not rub your eye. ? Wear dark glasses as needed to protect your eyes from bright light. ? Do not wear contact lenses until your eye feels normal again, or as instructed by your health care provider. ? Wear a protective eye covering if there is a risk of eye injury. This is important when working with high-speed tools.  If your eye is patched: ? Follow your health care provider's instructions for when to remove the patch. ? Do notdrive or operate machinery if your eye is patched. Your ability to judge distances is impaired.  Keep all follow-up visits as directed by your health care provider. This is important. Contact a health care provider if:  You have increased pain in your eye.  Your vision gets worse.  You have problems with your eye patch.  You have fluid (discharge) coming from your injured eye.  You have redness and swelling around your affected eye. This information is not intended to replace advice given to you by your health care provider. Make sure you discuss any questions you have with your health care provider. Document Released: 04/15/2005 Document Revised: 09/21/2015 Document Reviewed:   09/10/2012 Elsevier Interactive Patient Education  2017 [employer] Allergic Conjunctivitis, Adult Allergic conjunctivitis is inflammation of the clear membrane that covers the white part of your eye and the inner surface of your eyelid (conjunctiva). The inflammation is caused by allergies. The blood vessels in the conjunctiva become inflamed and this causes the eyes to become red or pink. The eyes often feel itchy. Allergic conjunctivitis cannot be spread from one person to another person (is not contagious). What are the  causes? This condition is caused by an allergic reaction. Common causes of an allergic reaction (allergens) include:  Outdoor allergens, such as: ? Pollen. ? Grass and weeds. ? Mold spores.  Indoor allergens, such as: ? Dust. ? Smoke. ? Mold. ? Pet dander. ? Animal hair.  What increases the risk? You may be more likely to develop this condition if you have a family history of allergies, such as:  Allergic rhinitis.  Bronchial asthma.  Atopic dermatitis.  What are the signs or symptoms? Symptoms of this condition include eyes that are:  Itchy.  Red.  Watery.  Puffy.  Your eyes may also:  Sting or burn.  Have clear drainage coming from them.  How is this diagnosed? This condition may be diagnosed by medical history and physical exam. If you have drainage from your eyes, it may be tested to rule out other causes of conjunctivitis. You may also need to see a health care provider who specializes in treating allergies (allergist) or eye conditions (ophthalmologist) for tests to confirm the diagnosis. You may have:  Skin tests to see which allergens are causing your symptoms. These tests involve pricking the skin with a tiny needle and exposing the skin to small amounts of potential allergens to see if your skin reacts.  Blood tests.  Tissue scrapings from your eyelid. These will be examined under a microscope.  How is this treated? Treatments for this condition may include:  Cold cloths (compresses) to soothe itching and swelling.  Washing the face to remove allergens.  Eye drops. These may be prescription or over-the-counter. There are several different types. You may need to try different types to see which one works best for you. Your may need: ? Eye drops that block the allergic reaction (antihistamine). ? Eye drops that reduce swelling and irritation (anti-inflammatory). ? Steroid eye drops to lessen a severe reaction (vernal conjunctivitis).  Oral  antihistamine medicines to reduce your allergic reaction. You may need these if eye drops do not help or are difficult to use.  Follow these instructions at home:  Avoid known allergens whenever possible.  Take or apply over-the-counter and prescription medicines only as told by your health care provider. These include any eye drops.  Apply a cool, clean washcloth to your eye for 10-20 minutes, 3-4 times a day.  Do not touch or rub your eyes.  Do not wear contact lenses until the inflammation is gone. Wear glasses instead.  Do not wear eye makeup until the inflammation is gone.  Keep all follow-up visits as told by your health care provider. This is important. Contact a health care provider if:  Your symptoms get worse or do not improve with treatment.  You have mild eye pain.  You have sensitivity to light.  You have spots or blisters on your eyes.  You have pus draining from your eye.  You have a fever. Get help right away if:  You have redness, swelling, or other symptoms in only one eye.  Your vision  is blurred or you have vision changes.  You have severe eye pain. This information is not intended to replace advice given to you by your health care provider. Make sure you discuss any questions you have with your health care provider. Document Released: 07/06/2002 Document Revised: 12/13/2015 Document Reviewed: 10/27/2015 Elsevier Interactive Patient Education  2018 [employer].

## 2017-06-03 ENCOUNTER — Ambulatory Visit: Payer: Self-pay | Admitting: Registered Nurse

## 2017-06-03 VITALS — BP 122/80 | HR 79 | Temp 98.3°F

## 2017-06-03 DIAGNOSIS — S70262A Insect bite (nonvenomous), left hip, initial encounter: Secondary | ICD-10-CM

## 2017-06-03 DIAGNOSIS — W57XXXA Bitten or stung by nonvenomous insect and other nonvenomous arthropods, initial encounter: Secondary | ICD-10-CM

## 2017-06-03 MED ORDER — DOXYCYCLINE HYCLATE 100 MG PO TABS: 100.0000 mg | ORAL_TABLET | Freq: Two times a day (BID) | ORAL | 0 refills | Status: DC

## 2017-06-03 NOTE — Progress Notes (Signed)
Subjective:    Patient ID: Teresa Mccormick, female    DOB: 1955-12-06, 62 y.o.   MRN: [id number]  61y/o caucasian married female established Pt reports L flank and low back being sore with redness after finding a tick embedded in skin. Low back and flank Has been painful for 4 days, noticed tick yesterday and spouse removed it. Believes they removed all of it.  It was dug into skin.  Worried she may have infection.  Low back pain resolved after tick removed but hip still sore denied drainage, fever, chills, joint aches, headache.  Was walking dogs in the woods on trail this weekend.      Review of Systems  Constitutional: Negative for activity change, appetite change, chills, diaphoresis, fatigue and fever.  HENT: Negative for trouble swallowing and voice change.   Eyes: Negative for photophobia and visual disturbance.  Respiratory: Negative for cough, shortness of breath, wheezing and stridor.   Gastrointestinal: Negative for abdominal pain, diarrhea, nausea and vomiting.  Endocrine: Negative for cold intolerance and heat intolerance.  Genitourinary: Negative for difficulty urinating.  Musculoskeletal: Positive for back pain and myalgias. Negative for arthralgias, gait problem, joint swelling, neck pain and neck stiffness.  Skin: Positive for color change and rash. Negative for pallor and wound.  Allergic/Immunologic: Negative for food allergies.  Neurological: Negative for dizziness, tremors, seizures, syncope, facial asymmetry, speech difficulty, weakness, light-headedness, numbness and headaches.  Psychiatric/Behavioral: Negative for agitation, confusion and sleep disturbance.       Objective:   Physical Exam  Constitutional: She is oriented to person, place, and time. Vital signs are normal. She appears well-developed and well-nourished. She is active and cooperative.  Non-toxic appearance. She does not have a sickly appearance. She does not appear ill. No distress.  HENT:  Head:  Normocephalic and atraumatic.  Right Ear: Hearing and external ear normal.  Left Ear: Hearing and external ear normal.  Nose: Nose normal.  Mouth/Throat: Uvula is midline, oropharynx is clear and moist and mucous membranes are normal. No oropharyngeal exudate.  Eyes: Conjunctivae, EOM and lids are normal. Pupils are equal, round, and reactive to light. Right eye exhibits no discharge. Left eye exhibits no discharge. No scleral icterus.  Neck: Trachea normal, normal range of motion and phonation normal. Neck supple. No muscular tenderness present. No neck rigidity. No tracheal deviation, no edema, no erythema and normal range of motion present.  Cardiovascular: Normal rate, regular rhythm, normal heart sounds and intact distal pulses.  Pulses:      Radial pulses are 2+ on the right side, and 2+ on the left side.  Pulmonary/Chest: Effort normal and breath sounds normal. No accessory muscle usage or stridor. No respiratory distress. She has no decreased breath sounds. She has no wheezes. She has no rhonchi. She has no rales. She exhibits no tenderness.  Abdominal: Soft. Normal appearance. She exhibits no distension, no fluid wave and no ascites. There is no rigidity and no guarding.  Musculoskeletal: Normal range of motion. She exhibits edema and tenderness. She exhibits no deformity.       Right shoulder: Normal.       Left shoulder: Normal.       Right elbow: Normal.      Left elbow: Normal.       Right hip: Normal.       Left hip: She exhibits tenderness. She exhibits normal range of motion, normal strength, no bony tenderness, no swelling, no crepitus, no deformity and no laceration.  Right knee: Normal.       Left knee: Normal.       Right ankle: Normal.       Left ankle: Normal.       Cervical back: Normal.       Thoracic back: Normal.       Lumbar back: Normal.       Right hand: Normal.       Left hand: Normal.  Lymphadenopathy:       Head (right side): No submental, no  submandibular and no preauricular adenopathy present.       Head (left side): No submental, no submandibular and no preauricular adenopathy present.    She has no cervical adenopathy.       Right cervical: No superficial cervical adenopathy present.      Left cervical: No superficial cervical adenopathy present.       Right: No inguinal adenopathy present.       Left: No inguinal adenopathy present.  Neurological: She is alert and oriented to person, place, and time. She has normal strength. She is not disoriented. She displays no atrophy and no tremor. No cranial nerve deficit or sensory deficit. She exhibits normal muscle tone. She displays no seizure activity. Coordination and gait normal. GCS eye subscore is 4. GCS verbal subscore is 5. GCS motor subscore is 6.  Gait sure and steady in hallway; on/off exam table; in/out of chair without difficulty  Skin: Skin is warm, dry and intact. Bruising and rash noted. No abrasion, no burn, no ecchymosis, no laceration, no lesion, no petechiae and no purpura noted. Rash is macular, papular, maculopapular and pustular. Rash is not nodular, not vesicular and not urticarial. She is not diaphoretic. There is erythema. No cyanosis. No pallor. Nails show no clubbing.     Single pustule 2mm on top of papule with surrounding macular erythema TTP 1cm diameter no fluctuance but TTP left hip laterally; centrally dark area bruising 3mm diameter  Psychiatric: She has a normal mood and affect. Her speech is normal and behavior is normal. Judgment and thought content normal. Cognition and memory are normal.  Nursing note and vitals reviewed.    Left hip pustule with macular erythema tenderness 1cm diameter and centrally dark area 3mm.    Assessment & Plan:  A-tick bite initial encounter  P-Follow up in 1 week at 1000 for re-ev aluation.  Has been longer than 72 hours.  Patient requesting treatment therefore will start with 10 days doxycycline 100mg  po BID #20 RF0  dispensed from [facility].  Avoid scratching area.  May apply heat/ice 15 minutes TID.  Tylenol 1000mg  po QID prn pain.  Given UD triple antibiotic ointment to apply BID after washing area with soap and water.  Given reusable ice/heat pack from clinic stock.    Discussed labs this soon probable false negative.  Patient to follow up with PCM or [place] if worsening symptoms or new symptoms for re-evaluation e.g. Fever, lethargy, hematemesis/hematuria/hemoptysis, wheezing, rash.  Patient given printed exitcare handouts on [place] spotted fever,tick bites, lyme disease and [email or web address] identifying ticks.  Discussed using DEET whenever walking dogs in tall grass/woods.  Patient verbalized understanding of information/instructions, agreed with plan of care and had no further questions at this time.

## 2017-06-03 NOTE — Patient Instructions (Signed)
[place] Spotted Fever [place] spotted fever is an illness that is spread to people by infected ticks. The illness causes flulike symptoms and a reddish-purple rash. This illness can quickly become very serious. Treatment must be started right away. When the illness is not treated right away, it can sometimes lead to long-term health problems or even death. This illness is most common during warm weather when ticks are most active. What are the causes? [place] spotted fever is caused by a type of bacteria that is called Rickettsia rickettsii. This type of bacteria is carried by American dog ticks and [place] wood ticks. People get infected through a bite from a tick that is infected with the bacteria. The bite is painless, and it frequently goes unnoticed. The bacteria can also infect a person when tick blood or tick feces get into a person's body through damaged skin. A tick bite is not necessary for an infection to occur. People can get [place] spotted fever if they get a tick's blood or body fluids on their skin in the area of a small cut or sore. This could happen while removing a tick from another person or a dog. The infection is not contagious, and it cannot be spread (transmitted) from person to person. What are the signs or symptoms? Symptoms may begin 2-14 days after a tick bite. The most common early symptoms are:  Fever.  Muscle aches.  Headache.  Nausea.  Vomiting.  Poor appetite.  Abdominal pain.  The reddish-purple rash usually appears 3-5 days after the first symptoms begin. The rash often starts on the wrists and ankles. It may then spread to the palms, the soles of the feet, the legs, and the trunk. How is this diagnosed? Diagnosis is based on a physical exam, medical history, and blood tests. Your health care provider may suspect [place] spotted fever in one of these cases:  If you have recently been bitten by a tick.  If you  have been in areas that have a lot of ticks or in areas where the disease is common.  How is this treated? It is important to begin treatment right away. Treatment will usually involve the use of antibiotic medicines. In some cases, your health care provider may begin treatment before the diagnosis is confirmed. If your symptoms are severe, a hospital stay may be needed. Follow these instructions at home:  Rest as much as possible until you feel better.  Take medicines only as directed by your health care provider.  Take your antibiotic medicine as directed by your health care provider. Finish the antibiotic even if you start to feel better.  Drink enough fluid to keep your urine clear or pale yellow.  Keep all follow-up visits as directed by your health care provider. This is important. How is this prevented? Avoiding tick bites can help to prevent this illness. Take these steps to avoid tick bites when you are outdoors:  Be aware that most ticks live in shrubs, low tree branches, and grassy areas. A tick can climb onto your body when you make contact with leaves or grass where the tick is waiting.  Wear protective clothing. Long sleeves and long pants are best.  Wear white clothes so you can see ticks more easily.  Tuck your pant legs into your socks.  If you go walking on a trail, stay in the middle of the trail to avoid brushing against bushes.  Avoid walking through areas that have long   grass.  Put insect repellent on all exposed skin and along boot tops, pant legs, and sleeve cuffs.  Check clothing, hair, and skin repeatedly and before going inside.  Check family members and pets for ticks.  Brush off any ticks that are not attached.  Take a shower or a bath as soon as possible after you have been outdoors. Check your skin for ticks. The most common places on the body where ticks attach themselves are the scalp, neck, armpits, waist, and groin.  You can also greatly  reduce your chances of getting [facility] spotted fever if you remove attached ticks as soon as possible. To remove an attached tick, use a forceps or fine-point tweezers to detach the intact tick without leaving its mouth parts in the skin. The wound from the tick bite should be washed after the tick has been removed. Contact a health care provider if:  You have drainage, swelling, or increased redness or pain in the area of the rash. Get help right away if:  You have chest pain.  You have shortness of breath.  You have a severe headache.  You have a seizure.  You have severe abdominal pain.  You are feeling confused.  You are bruising easily.  You have bleeding from your gums.  You have blood in your stool. This information is not intended to replace advice given to you by your health care provider. Make sure you discuss any questions you have with your health care provider. Document Released: 07/28/2000 Document Revised: 09/21/2015 Document Reviewed: 11/29/2013 Elsevier Interactive Patient Education  2018 [employer]. Lyme Disease Lyme disease is an infection that affects many parts of the body, including the skin, joints, and nervous system. It is a bacterial infection that starts from the bite of an infected tick. The infection can spread, and some of the symptoms are similar to the flu. If Lyme disease is not treated, it may cause joint pain, swelling, numbness, problems thinking, fatigue, muscle weakness, and other problems. What are the causes? This condition is caused by bacteria called Borrelia burgdorferi. You can get Lyme disease by being bitten by an infected tick. The tick must be attached to your skin to pass along the infection. Deer often carry infected ticks. What increases the risk? The following factors may make you more likely to develop this condition:  Living in or visiting these areas in the [place] ? [place]. ? [place]. ? [place].  Spending time in wooded or grassy areas.  Being outdoors with exposed skin.  Camping, gardening, hiking, fishing, or hunting outdoors.  Failing to remove a tick from your skin within 3-4 days.  What are the signs or symptoms? Symptoms of this condition include:  A round, red rash that surrounds the center of the tick bite. This is the first sign of infection. The center of the rash may be blood colored or have tiny blisters.  Fatigue.  Headache.  Chills and fever.  General achiness.  Joint pain, often in the knees.  Muscle pain.  Swollen lymph glands.  Stiff neck.  How is this diagnosed? This condition is diagnosed based on:  Your symptoms and medical history.  A physical exam.  A blood test.  How is this treated? The main treatment for this condition is antibiotic medicine, which is usually taken by mouth (orally). The length of treatment depends on how soon after a tick bite you begin taking the medicine. In some cases, treatment is  necessary for several weeks. If the infection is severe, antibiotics may need to be given through an IV tube that is inserted into one of your veins. Follow these instructions at home:  Take your antibiotic medicine as told by your health care provider. Do not stop taking the antibiotic even if you start to feel better.  Ask your health care provider about takinga probiotic in between doses of your antibiotic to help avoid stomach upset or diarrhea.  Check with your health care provider before supplementing your treatment. Many alternative therapies have not been proven and may be harmful to you.  Keep all follow-up visits as told by your health care provider. This is important. How is this prevented? You can become reinfected if you get another tick bite from an infected tick. Take these steps to help prevent an infection:  Cover your skin with light-colored clothing when you are outdoors in the spring and summer  months.  Spray clothing and skin with bug spray. The spray should be 20-30% DEET.  Avoid wooded, grassy, and shaded areas.  Remove yard litter, brush, trash, and plants that attract deer and rodents.  Check yourself for ticks when you come indoors.  Wash clothing worn each day.  Check your pets for ticks before they come inside.  If you find a tick: ? Remove it with tweezers. ? Clean your hands and the bite area with rubbing alcohol or soap and water.  Pregnant women should take special care to avoid tick bites because the infection can be passed along to the fetus. Contact a health care provider if:  You have symptoms after treatment.  You have removed a tick and want to bring it to your health care provider for testing. Get help right away if:  You have an irregular heartbeat.  You have nerve pain.  Your face feels numb. This information is not intended to replace advice given to you by your health care provider. Make sure you discuss any questions you have with your health care provider. Document Released: 07/22/2000 Document Revised: 12/05/2015 Document Reviewed: 12/05/2015 Elsevier Interactive Patient Education  2018 [employer]. Tick Bite Information, Adult Ticks are insects that draw blood for food. Most ticks live in shrubs and grassy areas. They climb onto people and animals that brush against the leaves and grasses that they rest on. Then they bite, attaching themselves to the skin. Most ticks are harmless, but some ticks carry germs that can spread to a person through a bite and cause a disease. To reduce your risk of getting a disease from a tick bite, it is important to take steps to prevent tick bites. It is also important to check for ticks after being outdoors. If you find that a tick has attached to you, watch for symptoms of disease. How can I prevent tick bites? Take these steps to help prevent tick bites when you are outdoors in an area where ticks are  found:  Use insect repellent that has DEET (20% or higher), picaridin, or IR3535 in it. Use it on: ? Skin that is showing. ? The top of your boots. ? Your pant legs. ? Your sleeve cuffs.  For repellent products that contain permethrin, follow product instructions. Use these products on: ? Clothing. ? Gear. ? Boots. ? Tents.  Wear protective clothing. Long sleeves and long pants offer the best protection from ticks.  Wear light-colored clothing so you can see ticks more easily.  Tuck your pant legs into your socks.  If you go walking on a trail, stay in the middle of the trail so your skin, hair, and clothing do not touch the bushes.  Avoid walking through areas with long grass.  Check for ticks on your clothing, hair, and skin often while you are outside, and check again before you go inside. Make sure to check the places that ticks attach themselves most often. These places include the scalp, neck, armpits, waist, groin, and joint areas. Ticks that carry a disease called Lyme disease have to be attached to the skin for 24-48 hours. Checking for ticks every day will lessen your risk of this and other diseases.  When you come indoors, wash your clothes and take a shower or a bath right away. Dry your clothes in a dryer on high heat for at least 60 minutes. This will kill any ticks in your clothes.  What is the proper way to remove a tick? If you find a tick on your body, remove it as soon as possible. Removing a tick sooner rather than later can prevent germs from passing from the tick to your body. To remove a tick that is crawling on your skin but has not bitten:  Go outdoors and brush the tick off.  Remove the tick with tape or a lint roller.  To remove a tick that is attached to your skin:  Wash your hands.  If you have latex gloves, put them on.  Use tweezers, curved forceps, or a tick-removal tool to gently grasp the tick as close to your skin and the tick's head as  possible.  Gently pull with steady, upward pressure until the tick lets go. When removing the tick: ? Take care to keep the tick's head attached to its body. ? Do not twist or jerk the tick. This can make the tick's head or mouth break off. ? Do not squeeze or crush the tick's body. This could force disease-carrying fluids from the tick into your body.  Do not try to remove a tick with heat, alcohol, petroleum jelly, or fingernail polish. Using these methods can cause the tick to salivate and regurgitate into your bloodstream, increasing your risk of getting a disease. What should I do after removing a tick?  Clean the bite area with soap and water, rubbing alcohol, or an iodine scrub.  If an antiseptic cream or ointment is available, apply a small amount to the bite site.  Wash and disinfect any instruments that you used to remove the tick. How should I dispose of a tick? To dispose of a live tick, use one of these methods:  Place it in rubbing alcohol.  Place it in a sealed bag or container.  Wrap it tightly in tape.  Flush it down the toilet.  Contact a health care provider if:  You have symptoms of a disease after a tick bite. Symptoms of a tick-borne disease can occur from moments after the tick bites to up to 30 days after a tick is removed. Symptoms include: ? Muscle, joint, or bone pain. ? Difficulty walking or moving your legs. ? Numbness in the legs. ? Paralysis. ? Red rash around the tick bite area that is shaped like a target or a "bull's-eye." ? Redness and swelling in the area of the tick bite. ? Fever. ? Repeated vomiting. ? Diarrhea. ? Weight loss. ? Tender, swollen lymph glands. ? Shortness of breath. ? Cough. ? Pain in the abdomen. ? Headache. ? Abnormal tiredness. ? A change  in your level of consciousness. ? Confusion. Get help right away if:  You are not able to remove a tick.  A part of a tick breaks off and gets stuck in your skin.  Your  symptoms get worse. Summary  Ticks may carry germs that can spread to a person through a bite and cause disease.  Wear protective clothing and use insect repellent to prevent tick bites. Follow product instructions.  If you find a tick on your body, remove it as soon as possible. If the tick is attached, do not try to remove with heat, alcohol, petroleum jelly, or fingernail polish.  Remove the attached tick using tweezers, curved forceps, or a tick-removal tool. Gently pull with steady, upward pressure until the tick lets go. Do not twist or jerk the tick. Do not squeeze or crush the tick's body.  If you have symptoms after being bitten by a tick, contact a health care provider. This information is not intended to replace advice given to you by your health care provider. Make sure you discuss any questions you have with your health care provider. Document Released: 04/12/2000 Document Revised: 01/26/2016 Document Reviewed: 01/26/2016 Elsevier Interactive Patient Education  [employer]2018 Else[employer] Inc[employer]

## 2017-06-10 ENCOUNTER — Ambulatory Visit: Payer: Self-pay | Admitting: Registered Nurse

## 2017-06-10 VITALS — BP 131/83 | HR 70 | Temp 97.6°F

## 2017-06-10 DIAGNOSIS — W57XXXD Bitten or stung by nonvenomous insect and other nonvenomous arthropods, subsequent encounter: Secondary | ICD-10-CM

## 2017-06-10 DIAGNOSIS — M545 Low back pain, unspecified: Secondary | ICD-10-CM

## 2017-06-10 NOTE — Progress Notes (Signed)
Subjective:    Patient ID: Teresa Mccormick, female    DOB: October 27, 1955, 62 y.o.   MRN: [id number]  62y/o caucasian female established Pt presents for f/u from tick bite encounter last week. 2 days Doxy left. Reports bruising and erythema have resolved. Denies pain or itching at site. Denies fever, lethargy, rash. Feels well and low back pain improved but not completely resolved worsens after shift at work  Frequent bending and lifting of items at work.  Denied loss of bowel bladder control, saddle paresthesias, arm/leg weakness/falls.      Review of Systems  Constitutional: Negative for activity change, appetite change, chills, diaphoresis, fatigue and fever.  HENT: Negative for congestion, ear pain and sore throat.   Eyes: Negative for pain and discharge.  Respiratory: Negative for cough and wheezing.   Cardiovascular: Negative for chest pain and leg swelling.  Gastrointestinal: Negative for blood in stool, diarrhea, nausea and vomiting.  Endocrine: Negative for cold intolerance and heat intolerance.  Genitourinary: Negative for difficulty urinating, dysuria and hematuria.  Musculoskeletal: Positive for back pain. Negative for arthralgias, gait problem, joint swelling, myalgias, neck pain and neck stiffness.  Skin: Negative for color change, pallor, rash and wound.  Allergic/Immunologic: Negative for environmental allergies and food allergies.  Neurological: Negative for dizziness, tremors, seizures, syncope, facial asymmetry, speech difficulty, weakness, light-headedness, numbness and headaches.  Hematological: Negative for adenopathy. Does not bruise/bleed easily.  Psychiatric/Behavioral: Negative for agitation, confusion, decreased concentration and sleep disturbance. The patient is not nervous/anxious.        Objective:   Physical Exam  Constitutional: She is oriented to person, place, and time. Vital signs are normal. She appears well-developed and well-nourished. She is active and  cooperative.  Non-toxic appearance. She does not have a sickly appearance. She does not appear ill. No distress.  HENT:  Head: Normocephalic and atraumatic.  Right Ear: Hearing and external ear normal.  Left Ear: Hearing and external ear normal.  Nose: Nose normal.  Mouth/Throat: Uvula is midline, oropharynx is clear and moist and mucous membranes are normal. No oropharyngeal exudate.  Eyes: Conjunctivae, EOM and lids are normal. Pupils are equal, round, and reactive to light. Right eye exhibits no discharge. Left eye exhibits no discharge. No scleral icterus.  Neck: Trachea normal, normal range of motion and phonation normal. Neck supple. No muscular tenderness present. No neck rigidity. No tracheal deviation, no edema, no erythema and normal range of motion present. No thyromegaly present.  Cardiovascular: Normal rate, regular rhythm, normal heart sounds and intact distal pulses.  Pulmonary/Chest: Effort normal. No accessory muscle usage or stridor. No respiratory distress. She has no wheezes. She has no rhonchi. She has no rales.  Abdominal: Soft. Normal appearance. She exhibits no distension, no fluid wave and no ascites. There is no tenderness. There is no rigidity, no guarding and no CVA tenderness.  Musculoskeletal: Normal range of motion. She exhibits no edema, tenderness or deformity.       Right shoulder: Normal.       Left shoulder: Normal.       Right elbow: Normal.      Left elbow: Normal.       Right hip: Normal.       Left hip: Normal.       Right knee: Normal.       Left knee: Normal.       Cervical back: Normal.       Thoracic back: Normal.       Lumbar back: She  exhibits pain. She exhibits normal range of motion, no tenderness, no bony tenderness, no swelling, no edema, no deformity, no laceration, no spasm and normal pulse.       Back:       Right hand: Normal.       Left hand: Normal.  Chronic low back pain for the past week; able to touch toes/on/off exam table  without difficulty; not TTP no spasms; normal heel toe gait; full c/t/l-spine arom  Lymphadenopathy:    She has no cervical adenopathy.       Right cervical: No superficial cervical adenopathy present.      Left cervical: No superficial cervical adenopathy present.       Left: No inguinal adenopathy present.  Neurological: She is alert and oriented to person, place, and time. She has normal strength. She is not disoriented. She displays no atrophy and no tremor. No cranial nerve deficit or sensory deficit. She exhibits normal muscle tone. She displays no seizure activity. Coordination and gait normal. GCS eye subscore is 4. GCS verbal subscore is 5. GCS motor subscore is 6.  Gait sure and steady in hallway; in/out chair/on/off exam table without difficulty  Skin: Skin is warm, dry and intact. No abrasion, no bruising, no burn, no ecchymosis, no laceration, no lesion, no petechiae and no rash noted. She is not diaphoretic. No cyanosis or erythema. No pallor. Nails show no clubbing.     2mm scab noted left lateral hip nontender/no fluctuance/no erythema/no rash  Psychiatric: She has a normal mood and affect. Her speech is normal and behavior is normal. Judgment and thought content normal. Cognition and memory are normal.  Nursing note and vitals reviewed.         Assessment & Plan:  A-tick bite subsequent encounter and low back pain acute  P-Discussed up to date/IDS guidelines for RMSF/Lyme treatment.  Patient decided to go with short course doxycycline versus extending to 15 or 21 days since her symptoms almost completely resolved.  Patient brought in tick in ziplock bag and it was black legged deer tick. Finish 10 days doxycycline 100mg  po BID #20 RF0 previously dispensed from [facility].  Avoid scratching area.  May apply heat/ice 15 minutes TID.  Tylenol 1000mg  po QID prn pain.  Patient reported rash resolved and back pain almost completely resolved.  Denied headache, weakness, fatigue,  arthralgias, nausea/vomiting, difficulty concentrating.  Patient to follow up with [facility] or [facility] clinic if worsening symptoms or new symptoms listed on handouts given to patient for RMSF/Lyme disease for re-evaluation Patient verbalized understanding of information/instructions, agreed with plan of care and had no further questions at this time.  For acute pain, rest, and intermittent application of heat (do not sleep on heating pad).  I discussed longer-term treatment plan of PRN PO NSAIDS and I discussed a home back care exercise program with a strengthening and flexibility exercise.  Patient given [facility] handout on low back strain with rehab exercises.  Proper avoidance of heavy lifting discussed.  Consider physical therapy or chiropractic care and radiology if not improving.  Call or return to clinic as needed if these symptoms worsen or fail to improve as anticipated especially leg weakness, loss of bowel/bladder control or saddle paresthesias.   Patient verbalized understanding of instructions/information and agreed with plan of care.  P2:  Injury Prevention, fitness

## 2017-06-11 NOTE — Patient Instructions (Addendum)
Tick Bite Information, Adult Ticks are insects that draw blood for food. Most ticks live in shrubs and grassy areas. They climb onto people and animals that brush against the leaves and grasses that they rest on. Then they bite, attaching themselves to the skin. Most ticks are harmless, but some ticks carry germs that can spread to a person through a bite and cause a disease. To reduce your risk of getting a disease from a tick bite, it is important to take steps to prevent tick bites. It is also important to check for ticks after being outdoors. If you find that a tick has attached to you, watch for symptoms of disease. How can I prevent tick bites? Take these steps to help prevent tick bites when you are outdoors in an area where ticks are found:  Use insect repellent that has DEET (20% or higher), picaridin, or IR3535 in it. Use it on: ? Skin that is showing. ? The top of your boots. ? Your pant legs. ? Your sleeve cuffs.  For repellent products that contain permethrin, follow product instructions. Use these products on: ? Clothing. ? Gear. ? Boots. ? Tents.  Wear protective clothing. Long sleeves and long pants offer the best protection from ticks.  Wear light-colored clothing so you can see ticks more easily.  Tuck your pant legs into your socks.  If you go walking on a trail, stay in the middle of the trail so your skin, hair, and clothing do not touch the bushes.  Avoid walking through areas with long grass.  Check for ticks on your clothing, hair, and skin often while you are outside, and check again before you go inside. Make sure to check the places that ticks attach themselves most often. These places include the scalp, neck, armpits, waist, groin, and joint areas. Ticks that carry a disease called Lyme disease have to be attached to the skin for 24-48 hours. Checking for ticks every day will lessen your risk of this and other diseases.  When you come indoors, wash your  clothes and take a shower or a bath right away. Dry your clothes in a dryer on high heat for at least 60 minutes. This will kill any ticks in your clothes.  What is the proper way to remove a tick? If you find a tick on your body, remove it as soon as possible. Removing a tick sooner rather than later can prevent germs from passing from the tick to your body. To remove a tick that is crawling on your skin but has not bitten:  Go outdoors and brush the tick off.  Remove the tick with tape or a lint roller.  To remove a tick that is attached to your skin:  Wash your hands.  If you have latex gloves, put them on.  Use tweezers, curved forceps, or a tick-removal tool to gently grasp the tick as close to your skin and the tick's head as possible.  Gently pull with steady, upward pressure until the tick lets go. When removing the tick: ? Take care to keep the tick's head attached to its body. ? Do not twist or jerk the tick. This can make the tick's head or mouth break off. ? Do not squeeze or crush the tick's body. This could force disease-carrying fluids from the tick into your body.  Do not try to remove a tick with heat, alcohol, petroleum jelly, or fingernail polish. Using these methods can cause the tick to salivate and   regurgitate into your bloodstream, increasing your risk of getting a disease. What should I do after removing a tick?  Clean the bite area with soap and water, rubbing alcohol, or an iodine scrub.  If an antiseptic cream or ointment is available, apply a small amount to the bite site.  Wash and disinfect any instruments that you used to remove the tick. How should I dispose of a tick? To dispose of a live tick, use one of these methods:  Place it in rubbing alcohol.  Place it in a sealed bag or container.  Wrap it tightly in tape.  Flush it down the toilet.  Contact a health care provider if:  You have symptoms of a disease after a tick bite. Symptoms of a  tick-borne disease can occur from moments after the tick bites to up to 30 days after a tick is removed. Symptoms include: ? Muscle, joint, or bone pain. ? Difficulty walking or moving your legs. ? Numbness in the legs. ? Paralysis. ? Red rash around the tick bite area that is shaped like a target or a "bull's-eye." ? Redness and swelling in the area of the tick bite. ? Fever. ? Repeated vomiting. ? Diarrhea. ? Weight loss. ? Tender, swollen lymph glands. ? Shortness of breath. ? Cough. ? Pain in the abdomen. ? Headache. ? Abnormal tiredness. ? A change in your level of consciousness. ? Confusion. Get help right away if:  You are not able to remove a tick.  A part of a tick breaks off and gets stuck in your skin.  Your symptoms get worse. Summary  Ticks may carry germs that can spread to a person through a bite and cause disease.  Wear protective clothing and use insect repellent to prevent tick bites. Follow product instructions.  If you find a tick on your body, remove it as soon as possible. If the tick is attached, do not try to remove with heat, alcohol, petroleum jelly, or fingernail polish.  Remove the attached tick using tweezers, curved forceps, or a tick-removal tool. Gently pull with steady, upward pressure until the tick lets go. Do not twist or jerk the tick. Do not squeeze or crush the tick's body.  If you have symptoms after being bitten by a tick, contact a health care provider. This information is not intended to replace advice given to you by your health care provider. Make sure you discuss any questions you have with your health care provider. Document Released: 04/12/2000 Document Revised: 01/26/2016 Document Reviewed: 01/26/2016 Elsevier Interactive Patient Education  2018 [employer].  Low Back Sprain Rehab Ask your health care provider which exercises are safe for you. Do exercises exactly as told by your health care provider and adjust them as  directed. It is normal to feel mild stretching, pulling, tightness, or discomfort as you do these exercises, but you should stop right away if you feel sudden pain or your pain gets worse. Do not begin these exercises until told by your health care provider. Stretching and range of motion exercises These exercises warm up your muscles and joints and improve the movement and flexibility of your back. These exercises also help to relieve pain, numbness, and tingling. Exercise A: Lumbar rotation  1. Lie on your back on a firm surface and bend your knees. 2. Straighten your arms out to your sides so each arm forms an "L" shape with a side of your body (a 90 degree angle). 3. Slowly move both of your  knees to one side of your body until you feel a stretch in your lower back. Try not to let your shoulders move off of the floor. 4. Hold for __________ seconds. 5. Tense your abdominal muscles and slowly move your knees back to the starting position. 6. Repeat this exercise on the other side of your body. Repeat __________ times. Complete this exercise __________ times a day. Exercise B: Prone extension on elbows  1. Lie on your abdomen on a firm surface. 2. Prop yourself up on your elbows. 3. Use your arms to help lift your chest up until you feel a gentle stretch in your abdomen and your lower back. ? This will place some of your body weight on your elbows. If this is uncomfortable, try stacking pillows under your chest. ? Your hips should stay down, against the surface that you are lying on. Keep your hip and back muscles relaxed. 4. Hold for __________ seconds. 5. Slowly relax your upper body and return to the starting position. Repeat __________ times. Complete this exercise __________ times a day. Strengthening exercises These exercises build strength and endurance in your back. Endurance is the ability to use your muscles for a long time, even after they get tired. Exercise C: Pelvic  tilt 1. Lie on your back on a firm surface. Bend your knees and keep your feet flat. 2. Tense your abdominal muscles. Tip your pelvis up toward the ceiling and flatten your lower back into the floor. ? To help with this exercise, you may place a small towel under your lower back and try to push your back into the towel. 3. Hold for __________ seconds. 4. Let your muscles relax completely before you repeat this exercise. Repeat __________ times. Complete this exercise __________ times a day. Exercise D: Alternating arm and leg raises  1. Get on your hands and knees on a firm surface. If you are on a hard floor, you may want to use padding to cushion your knees, such as an exercise mat. 2. Line up your arms and legs. Your hands should be below your shoulders, and your knees should be below your hips. 3. Lift your left leg behind you. At the same time, raise your right arm and straighten it in front of you. ? Do not lift your leg higher than your hip. ? Do not lift your arm higher than your shoulder. ? Keep your abdominal and back muscles tight. ? Keep your hips facing the ground. ? Do not arch your back. ? Keep your balance carefully, and do not hold your breath. 4. Hold for __________ seconds. 5. Slowly return to the starting position and repeat with your right leg and your left arm. Repeat __________ times. Complete this exercise __________ times a day. Exercise E: Abdominal set with straight leg raise  1. Lie on your back on a firm surface. 2. Bend one of your knees and keep your other leg straight. 3. Tense your abdominal muscles and lift your straight leg up, 4-6 inches (10-15 cm) off the ground. 4. Keep your abdominal muscles tight and hold for __________ seconds. ? Do not hold your breath. ? Do not arch your back. Keep it flat against the ground. 5. Keep your abdominal muscles tense as you slowly lower your leg back to the starting position. 6. Repeat with your other leg. Repeat  __________ times. Complete this exercise __________ times a day. Posture and body mechanics  Body mechanics refers to the movements and positions of your  body while you do your daily activities. Posture is part of body mechanics. Good posture and healthy body mechanics can help to relieve stress in your body's tissues and joints. Good posture means that your spine is in its natural S-curve position (your spine is neutral), your shoulders are pulled back slightly, and your head is not tipped forward. The following are general guidelines for applying improved posture and body mechanics to your everyday activities. Standing   When standing, keep your spine neutral and your feet about hip-width apart. Keep a slight bend in your knees. Your ears, shoulders, and hips should line up.  When you do a task in which you stand in one place for a long time, place one foot up on a stable object that is 2-4 inches (5-10 cm) high, such as a footstool. This helps keep your spine neutral. Sitting   When sitting, keep your spine neutral and keep your feet flat on the floor. Use a footrest, if necessary, and keep your thighs parallel to the floor. Avoid rounding your shoulders, and avoid tilting your head forward.  When working at a desk or a computer, keep your desk at a height where your hands are slightly lower than your elbows. Slide your chair under your desk so you are close enough to maintain good posture.  When working at a computer, place your monitor at a height where you are looking straight ahead and you do not have to tilt your head forward or downward to look at the screen. Resting   When lying down and resting, avoid positions that are most painful for you.  If you have pain with activities such as sitting, bending, stooping, or squatting (flexion-based activities), lie in a position in which your body does not bend very much. For example, avoid curling up on your side with your arms and knees near  your chest (fetal position).  If you have pain with activities such as standing for a long time or reaching with your arms (extension-based activities), lie with your spine in a neutral position and bend your knees slightly. Try the following positions:  Lying on your side with a pillow between your knees.  Lying on your back with a pillow under your knees. Lifting   When lifting objects, keep your feet at least shoulder-width apart and tighten your abdominal muscles.  Bend your knees and hips and keep your spine neutral. It is important to lift using the strength of your legs, not your back. Do not lock your knees straight out.  Always ask for help to lift heavy or awkward objects. This information is not intended to replace advice given to you by your health care provider. Make sure you discuss any questions you have with your health care provider. Document Released: 04/15/2005 Document Revised: 12/21/2015 Document Reviewed: 01/25/2015 Elsevier Interactive Patient Education  [employer]2018 [employer]

## 2017-10-06 DIAGNOSIS — M79671 Pain in right foot: Secondary | ICD-10-CM | POA: Insufficient documentation

## 2017-10-06 DIAGNOSIS — M25572 Pain in left ankle and joints of left foot: Secondary | ICD-10-CM | POA: Insufficient documentation

## 2017-10-09 ENCOUNTER — Ambulatory Visit: Payer: Self-pay | Admitting: Registered Nurse

## 2017-10-09 ENCOUNTER — Encounter: Payer: Self-pay | Admitting: Registered Nurse

## 2017-10-09 VITALS — HR 76 | Resp 16

## 2017-10-09 DIAGNOSIS — R7989 Other specified abnormal findings of blood chemistry: Secondary | ICD-10-CM | POA: Insufficient documentation

## 2017-10-09 DIAGNOSIS — J00 Acute nasopharyngitis [common cold]: Secondary | ICD-10-CM

## 2017-10-09 MED ORDER — SALINE SPRAY 0.65 % NA SOLN: 2.0000 | NASAL | 0 refills | Status: DC

## 2017-10-09 MED ORDER — FLUTICASONE PROPIONATE 50 MCG/ACT NA SUSP: 2.0000 | Freq: Every day | NASAL | 6 refills | Status: DC

## 2017-10-09 MED ORDER — LORATADINE 10 MG PO TABS: 10.0000 mg | ORAL_TABLET | Freq: Every day | ORAL | 11 refills | Status: DC

## 2017-10-09 MED ORDER — CHOLECALCIFEROL 50 MCG (2000 UT) PO CAPS: 1.0000 | ORAL_CAPSULE | Freq: Every day | ORAL | 0 refills | Status: AC

## 2017-10-09 NOTE — Patient Instructions (Addendum)
Vitamin D Deficiency °Vitamin D deficiency is when your body does not have enough vitamin D. Vitamin D is important to your body for many reasons: °· It helps the body to absorb two important minerals, called calcium and phosphorus. °· It plays a role in bone health. °· It may help to prevent some diseases, such as diabetes and multiple sclerosis. °· It plays a role in muscle function, including heart function. ° °You can get vitamin D by: °· Eating foods that naturally contain vitamin D. °· Eating or drinking milk or other dairy products that have vitamin D added to them. °· Taking a vitamin D supplement or a multivitamin supplement that contains vitamin D. °· Being in the sun. Your body naturally makes vitamin D when your skin is exposed to sunlight. Your body changes the sunlight into a form of the vitamin that the body can use. ° °If vitamin D deficiency is severe, it can cause a condition in which your bones become soft. In adults, this condition is called osteomalacia. In children, this condition is called rickets. °What are the causes? °Vitamin D deficiency may be caused by: °· Not eating enough foods that contain vitamin D. °· Not getting enough sun exposure. °· Having certain digestive system diseases that make it difficult for your body to absorb vitamin D. These diseases include Crohn disease, chronic pancreatitis, and cystic fibrosis. °· Having a surgery in which a part of the stomach or a part of the small intestine is removed. °· Being obese. °· Having chronic kidney disease or liver disease. ° °What increases the risk? °This condition is more likely to develop in: °· Older people. °· People who do not spend much time outdoors. °· People who live in a long-term care facility. °· People who have had broken bones. °· People with weak or thin bones (osteoporosis). °· People who have a disease or condition that changes how the body absorbs vitamin D. °· People who have dark skin. °· People who take certain  medicines, such as steroid medicines or certain seizure medicines. °· People who are overweight or obese. ° °What are the signs or symptoms? °In mild cases of vitamin D deficiency, there may not be any symptoms. If the condition is severe, symptoms may include: °· Bone pain. °· Muscle pain. °· Falling often. °· Broken bones caused by a minor injury. ° °How is this diagnosed? °This condition is usually diagnosed with a blood test. °How is this treated? °Treatment for this condition may depend on what caused the condition. Treatment options include: °· Taking vitamin D supplements. °· Taking a calcium supplement. Your health care provider will suggest what dose is best for you. ° °Follow these instructions at home: °· Take medicines and supplements only as told by your health care provider. °· Eat foods that contain vitamin D. Choices include: °? Fortified dairy products, cereals, or juices. Fortified means that vitamin D has been added to the food. Check the label on the package to be sure. °? Fatty fish, such as salmon or trout. °? Eggs. °? Oysters. °· Do not use a tanning bed. °· Maintain a healthy weight. Lose weight, if needed. °· Keep all follow-up visits as told by your health care provider. This is important. °Contact a health care provider if: °· Your symptoms do not go away. °· You feel like throwing up (nausea) or you throw up (vomit). °· You have fewer bowel movements than usual or it is difficult for you to have a   bowel movement (constipation). This information is not intended to replace advice given to you by your health care provider. Make sure you discuss any questions you have with your health care provider. Document Released: 07/08/2011 Document Revised: 09/27/2015 Document Reviewed: 08/31/2014 Elsevier Interactive Patient Education  2018 [employer] Allergic Rhinitis, Adult Allergic rhinitis is an allergic reaction that affects the mucous membrane inside the nose. It causes sneezing, a runny  or stuffy nose, and the feeling of mucus going down the back of the throat (postnasal drip). Allergic rhinitis can be mild to severe. There are two types of allergic rhinitis:  Seasonal. This type is also called hay fever. It happens only during certain seasons.  Perennial. This type can happen at any time of the year.  What are the causes? This condition happens when the body's defense system (immune system) responds to certain harmless substances called allergens as though they were germs.  Seasonal allergic rhinitis is triggered by pollen, which can come from grasses, trees, and weeds. Perennial allergic rhinitis may be caused by:  House dust mites.  Pet dander.  Mold spores.  What are the signs or symptoms? Symptoms of this condition include:  Sneezing.  Runny or stuffy nose (nasal congestion).  Postnasal drip.  Itchy nose.  Tearing of the eyes.  Trouble sleeping.  Daytime sleepiness.  How is this diagnosed? This condition may be diagnosed based on:  Your medical history.  A physical exam.  Tests to check for related conditions, such as: ? Asthma. ? Pink eye. ? Ear infection. ? Upper respiratory infection.  Tests to find out which allergens trigger your symptoms. These may include skin or blood tests.  How is this treated? There is no cure for this condition, but treatment can help control symptoms. Treatment may include:  Taking medicines that block allergy symptoms, such as antihistamines. Medicine may be given as a shot, nasal spray, or pill.  Avoiding the allergen.  Desensitization. This treatment involves getting ongoing shots until your body becomes less sensitive to the allergen. This treatment may be done if other treatments do not help.  If taking medicine and avoiding the allergen does not work, new, stronger medicines may be prescribed.  Follow these instructions at home:  Find out what you are allergic to. Common allergens include smoke,  dust, and pollen.  Avoid the things you are allergic to. These are some things you can do to help avoid allergens: ? Replace carpet with wood, tile, or vinyl flooring. Carpet can trap dander and dust. ? Do not smoke. Do not allow smoking in your home. ? Change your heating and air conditioning filter at least once a month. ? During allergy season:  Keep windows closed as much as possible.  Plan outdoor activities when pollen counts are lowest. This is usually during the evening hours.  When coming indoors, change clothing and shower before sitting on furniture or bedding.  Take over-the-counter and prescription medicines only as told by your health care provider.  Keep all follow-up visits as told by your health care provider. This is important. Contact a health care provider if:  You have a fever.  You develop a persistent cough.  You make whistling sounds when you breathe (you wheeze).  Your symptoms interfere with your normal daily activities. Get help right away if:  You have shortness of breath. Summary  This condition can be managed by taking medicines as directed and avoiding allergens.  Contact your health care provider if you develop a  persistent cough or fever.  During allergy season, keep windows closed as much as possible. This information is not intended to replace advice given to you by your health care provider. Make sure you discuss any questions you have with your health care provider. Document Released: 01/08/2001 Document Revised: 05/23/2016 Document Reviewed: 05/23/2016 Elsevier Interactive Patient Education  2018 [employer]. Sinus Rinse What is a sinus rinse? A sinus rinse is a simple home treatment that is used to rinse your sinuses with a sterile mixture of salt and water (saline solution). Sinuses are air-filled spaces in your skull behind the bones of your face and forehead that open into your nasal cavity. You will use the following:  Saline  solution.  Neti pot or spray bottle. This releases the saline solution into your nose and through your sinuses. Neti pots and spray bottles can be purchased at [employer], a health food store, or online.  When would I do a sinus rinse? A sinus rinse can help to clear mucus, dirt, dust, or pollen from the nasal cavity. You may do a sinus rinse when you have a cold, a virus, nasal allergy symptoms, a sinus infection, or stuffiness in the nose or sinuses. If you are considering a sinus rinse:  Ask your child's health care provider before performing a sinus rinse on your child.  Do not do a sinus rinse if you have had ear or nasal surgery, ear infection, or blocked ears.  How do I do a sinus rinse?  Wash your hands.  Disinfect your device according to the directions provided and then dry it.  Use the solution that comes with your device or one that is sold separately in stores. Follow the mixing directions on the package.  Fill your device with the amount of saline solution as directed by the device instructions.  Stand over a sink and tilt your head sideways over the sink.  Place the spout of the device in your upper nostril (the one closer to the ceiling).  Gently pour or squeeze the saline solution into the nasal cavity. The liquid should drain to the lower nostril if you are not overly congested.  Gently blow your nose. Blowing too hard may cause ear pain.  Repeat in the other nostril.  Clean and rinse your device with clean water and then air-dry it. Are there risks of a sinus rinse? Sinus rinse is generally very safe and effective. However, there are a few risks, which include:  A burning sensation in the sinuses. This may happen if you do not make the saline solution as directed. Make sure to follow all directions when making the saline solution.  Infection from contaminated water. This is rare, but possible.  Nasal irritation.  This information is not intended  to replace advice given to you by your health care provider. Make sure you discuss any questions you have with your health care provider. Document Released: 11/10/2013 Document Revised: 03/12/2016 Document Reviewed: 08/31/2013 Elsevier Interactive Patient Education  2017 [employer].

## 2017-10-09 NOTE — Progress Notes (Signed)
Subjective:    Patient ID: Teresa Mccormick, female    DOB: February 27, 1956, 62 y.o.   MRN: [id number]  Has been having metallic taste in mouth.  Last dental work greater than 1 month ago.  Rhinitis a couple weeks ago resolved.  Be Well August 2018 low vitamin D was on 50,000 units weekly and then was supposed to start 2000 IU daily OTC when Rx finished but never did.  Gets some sunlight when they go to watch grandson play ball on his sports team.  No history anemia.  Eats meat a couple times per week but not daily every meal.  Hx elevated iron level a few years ago was normal last year drinks bottled water from [employer] unsure if purified or distilled or tap.  Eats meat 3-4 times per week.  Denied paresthesias, arm/leg weakness, dysphasia/dysphagia.     Review of Systems  Constitutional: Negative for activity change, appetite change, chills, diaphoresis, fatigue, fever and unexpected weight change.  HENT: Positive for rhinorrhea. Negative for congestion, dental problem, drooling, ear discharge, ear pain, facial swelling, hearing loss, mouth sores, nosebleeds, postnasal drip, sinus pressure, sinus pain, sneezing, sore throat, tinnitus, trouble swallowing and voice change.   Eyes: Negative for photophobia, pain, discharge, redness, itching and visual disturbance.  Respiratory: Negative for cough, choking, chest tightness, shortness of breath, wheezing and stridor.   Cardiovascular: Negative for chest pain, palpitations and leg swelling.  Gastrointestinal: Negative for abdominal distention, abdominal pain, blood in stool, constipation, diarrhea, nausea and vomiting.  Endocrine: Negative for cold intolerance and heat intolerance.  Genitourinary: Negative for difficulty urinating, dysuria and hematuria.  Musculoskeletal: Negative for arthralgias, back pain, gait problem, joint swelling, myalgias, neck pain and neck stiffness.  Skin: Negative for color change, pallor, rash and wound.  Allergic/Immunologic:  Positive for environmental allergies. Negative for food allergies.  Neurological: Negative for dizziness, tremors, seizures, syncope, facial asymmetry, speech difficulty, weakness, light-headedness, numbness and headaches.  Hematological: Negative for adenopathy. Does not bruise/bleed easily.  Psychiatric/Behavioral: Negative for agitation, confusion and sleep disturbance.       Objective:   Physical Exam  Constitutional: She is oriented to person, place, and time. Vital signs are normal. She appears well-developed and well-nourished. She is active and cooperative.  Non-toxic appearance. She does not have a sickly appearance. She does not appear ill. No distress.  HENT:  Head: Normocephalic and atraumatic.  Right Ear: Hearing, external ear and ear canal normal. A middle ear effusion is present.  Left Ear: Hearing, external ear and ear canal normal. A middle ear effusion is present.  Nose: Mucosal edema and rhinorrhea present. No nose lacerations, sinus tenderness, nasal deformity, septal deviation or nasal septal hematoma. No epistaxis.  No foreign bodies. Right sinus exhibits no maxillary sinus tenderness and no frontal sinus tenderness. Left sinus exhibits no maxillary sinus tenderness and no frontal sinus tenderness.  Mouth/Throat: Uvula is midline and mucous membranes are normal. Mucous membranes are not pale, not dry and not cyanotic. She does not have dentures. No oral lesions. No trismus in the jaw. Normal dentition. No dental abscesses, uvula swelling, lacerations or dental caries. Posterior oropharyngeal edema and posterior oropharyngeal erythema present. No oropharyngeal exudate or tonsillar abscesses.  Bilateral allergic shiners; scant clear discharge bilateral nares; cobblestoning posterior pharynx; bilateral TMs air fluid level clear  Eyes: Pupils are equal, round, and reactive to light. Conjunctivae, EOM and lids are normal. Right eye exhibits no chemosis, no discharge, no exudate and  no hordeolum. No foreign body  present in the right eye. Left eye exhibits no chemosis, no discharge, no exudate and no hordeolum. No foreign body present in the left eye. Right conjunctiva is not injected. Right conjunctiva has no hemorrhage. Left conjunctiva is not injected. Left conjunctiva has no hemorrhage. No scleral icterus. Right eye exhibits normal extraocular motion and no nystagmus. Left eye exhibits normal extraocular motion and no nystagmus. Right pupil is round and reactive. Left pupil is round and reactive. Pupils are equal.  Neck: Trachea normal, normal range of motion and phonation normal. Neck supple. No tracheal tenderness, no spinous process tenderness and no muscular tenderness present. No neck rigidity. No tracheal deviation, no edema, no erythema and normal range of motion present. No thyroid mass and no thyromegaly present.  Cardiovascular: Normal rate, regular rhythm, S1 normal, S2 normal, normal heart sounds and intact distal pulses. PMI is not displaced. Exam reveals no gallop, no distant heart sounds and no friction rub.  No murmur heard. Pulmonary/Chest: Effort normal and breath sounds normal. No accessory muscle usage or stridor. No respiratory distress. She has no decreased breath sounds. She has no wheezes. She has no rhonchi. She has no rales. She exhibits no tenderness.  No cough observed in exam room ;spoke full sentences without difficulty  Abdominal: Soft. Normal appearance. She exhibits no distension, no fluid wave and no ascites. There is no rigidity and no guarding.  Musculoskeletal: Normal range of motion. She exhibits no edema or tenderness.       Right shoulder: Normal.       Left shoulder: Normal.       Right elbow: Normal.      Left elbow: Normal.       Right hip: Normal.       Left hip: Normal.       Right knee: Normal.       Left knee: Normal.       Cervical back: Normal.       Thoracic back: Normal.       Lumbar back: Normal.       Right hand: Normal.        Left hand: Normal.  Lymphadenopathy:       Head (right side): No submental, no submandibular, no tonsillar, no preauricular, no posterior auricular and no occipital adenopathy present.       Head (left side): No submental, no submandibular, no tonsillar, no preauricular, no posterior auricular and no occipital adenopathy present.    She has no cervical adenopathy.       Right cervical: No superficial cervical, no deep cervical and no posterior cervical adenopathy present.      Left cervical: No superficial cervical, no deep cervical and no posterior cervical adenopathy present.  Neurological: She is alert and oriented to person, place, and time. She has normal strength. She is not disoriented. She displays no atrophy and no tremor. No cranial nerve deficit or sensory deficit. She exhibits normal muscle tone. She displays no seizure activity. Coordination and gait normal. GCS eye subscore is 4. GCS verbal subscore is 5. GCS motor subscore is 6.  Gait sure and steady in hallway; in/out of chair/on/off exam table without difficulty  Skin: Skin is warm, dry and intact. Capillary refill takes less than 2 seconds. No abrasion, no bruising, no burn, no ecchymosis, no laceration, no lesion, no petechiae and no rash noted. She is not diaphoretic. No cyanosis or erythema. No pallor. Nails show no clubbing.  Psychiatric: She has a normal mood and affect. Her  speech is normal and behavior is normal. Judgment and thought content normal. Cognition and memory are normal.  Nursing note and vitals reviewed.         Assessment & Plan:  A-low vitamin D level, acute rhinitis, bilateral otitis media with effusion  P-start vitamin D3 2000 IU po daily OTC or 15 minutes sunlight with tanktop and shorts or above knee skirt per day.  Annual labs due Aug 2019 consider in July after 30 days sunlight or supplement.  Add zinc/B6/B12 to female executive panel along with Vitamin D.  Discussed with patient metallic taste  in mouth could be from dental work; post nasal drip/sinusitis/B6/12/zinc deficiency.  Patient reported the cholecalciferol 50000 units per week tasted very bad to her.  Drinks bottled water.  Lead in past level high in past 2 years last year normal . 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  Discussed with patient there are other conditions or medications that can cause metallic taste in mouth but we will start wilth most common/easily excluded.Exitcare handout low vitamin D.  Patient verbalized understanding information/instructions, agreed with plan of care and had no further questions at this time.  Patient may use normal saline nasal spray 2 sprays each nostril q2h wa as needed given 1 bottle from clinic stock. flonase 50mcg 1 spray each nostril BID #1 RF6 electronic Rx to her pharmacy of choice.  Patient denied personal or family history of ENT cancer.  OTC antihistamine of choice claritin/zyrtec 10mg  po daily.  Avoid triggers if possible.  Shower prior to bedtime if exposed to triggers.  If allergic dust/dust mites recommend mattress/pillow covers/encasements; washing linens, vacuuming, sweeping, dusting weekly.  Call or return to clinic as needed if these symptoms worsen or fail to improve as anticipated.   Exitcare handout on allergic rhinitis and sinus rinse given to patient.  Patient verbalized understanding of instructions, agreed with plan of care and had no further questions at this time.  P2:  Avoidance and hand washing.  Supportive treatment.   No evidence of invasive bacterial infection, non toxic and well hydrated.  This is most likely self limiting viral infection.  I do not see where any further testing or imaging is necessary at this time.   I will suggest supportive care, rest, good hygiene and encourage the patient to take adequate fluids.  The patient is to return to clinic or EMERGENCY ROOM if symptoms worsen or change significantly e.g. ear pain,  fever, purulent discharge from ears or bleeding. .  Patient verbalized agreement and understanding of treatment plan.

## 2017-12-31 IMAGING — US US ABDOMEN LIMITED
1 series · 14 of 25 positions shown · non-contrast
Comparison: None.

CLINICAL DATA: Elevated liver function tests, episode of severe
epigastric pain with nausea

EXAM:
US ABDOMEN LIMITED - RIGHT UPPER QUADRANT

[Series 1: us abdomen limited · 0.30mm/px · 14 of 50 slices shown]
[im 1/50]
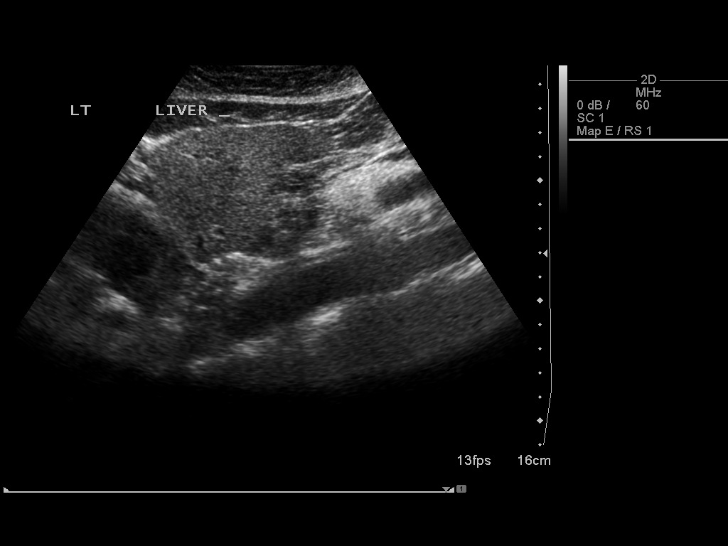
[im 5/50]
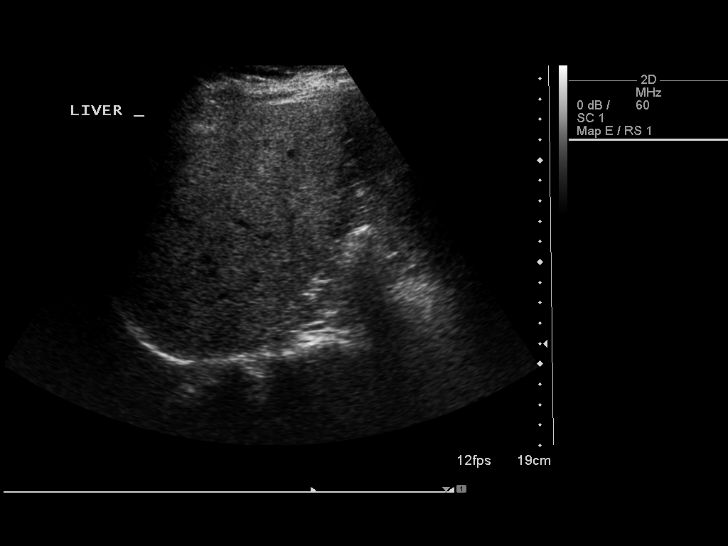
[im 9/50]
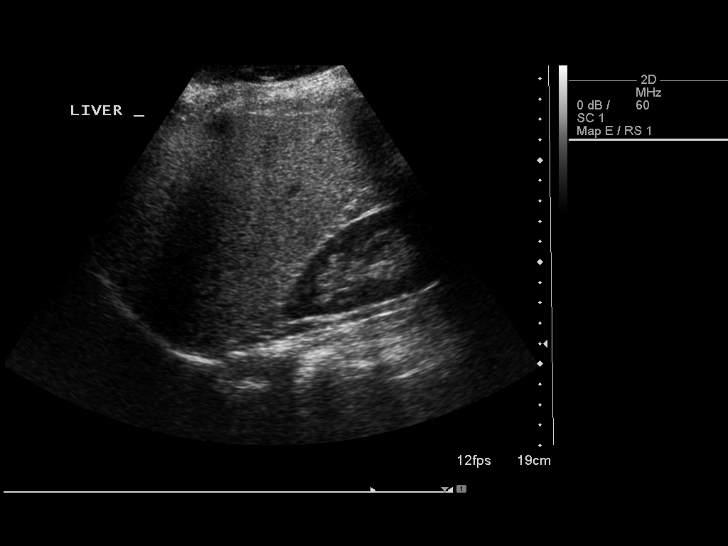
[im 13/50]
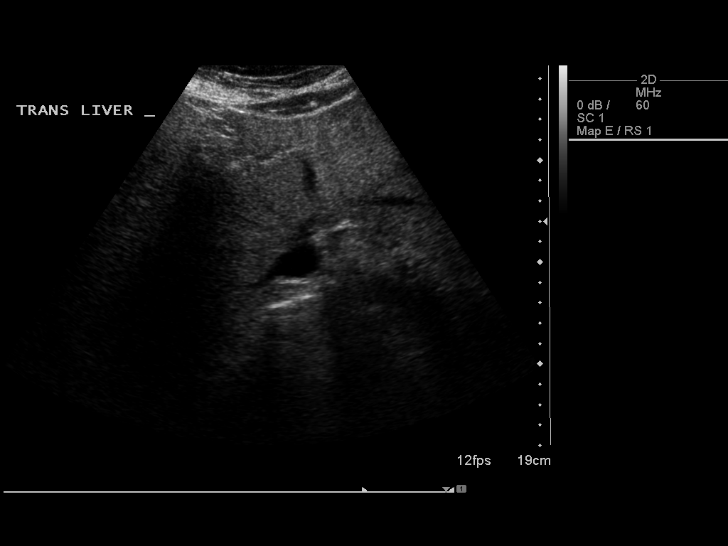
[im 17/50]
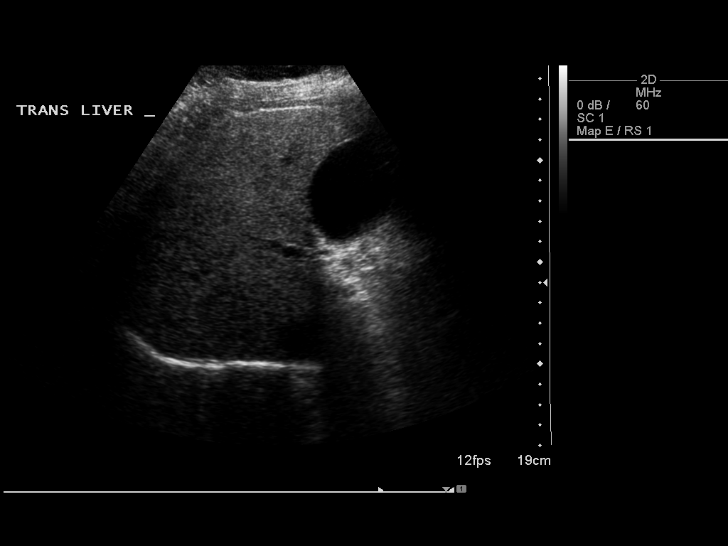
[im 19/50]
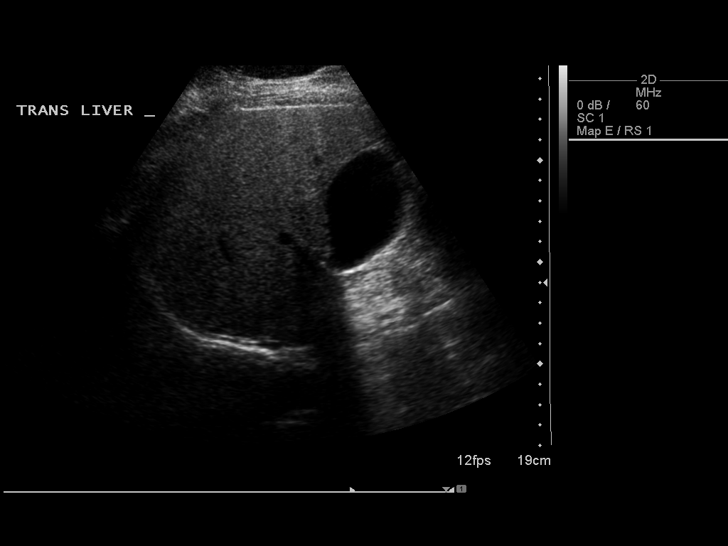
[im 23/50]
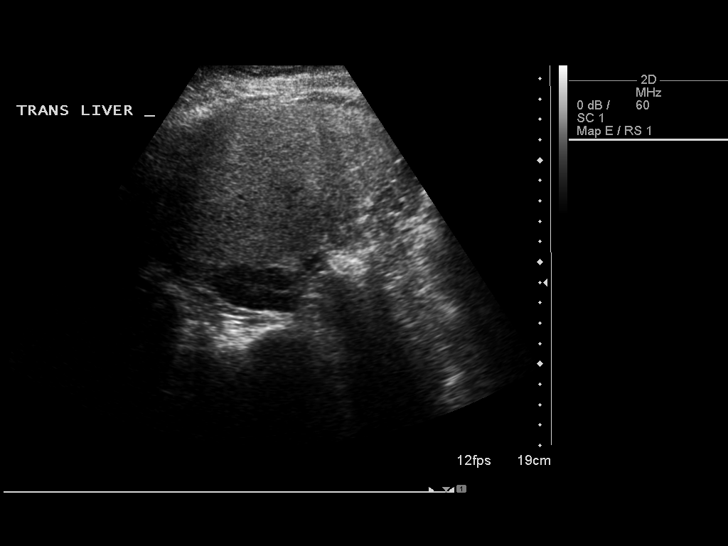
[im 27/50]
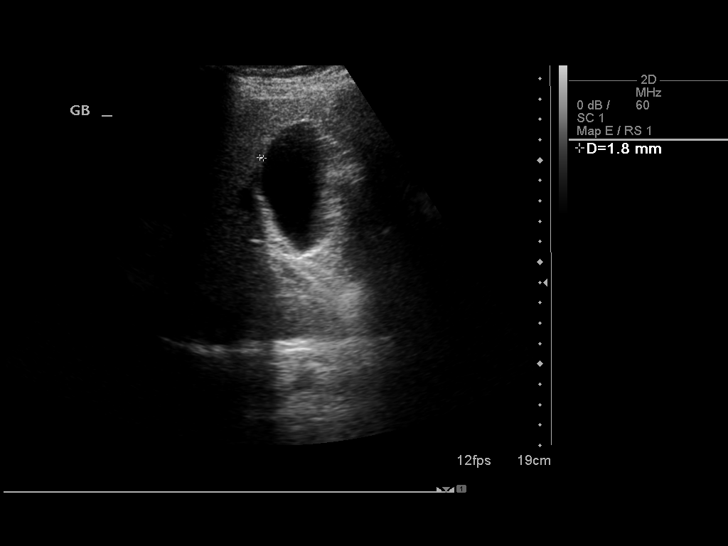
[im 31/50]
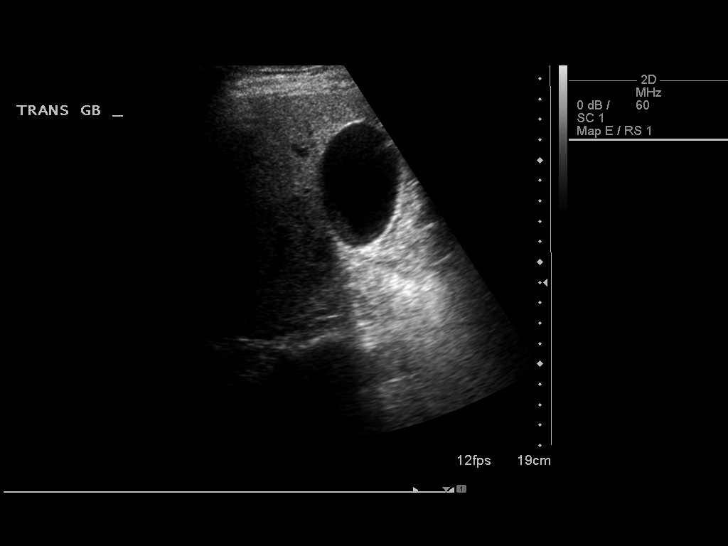
[im 33/50]
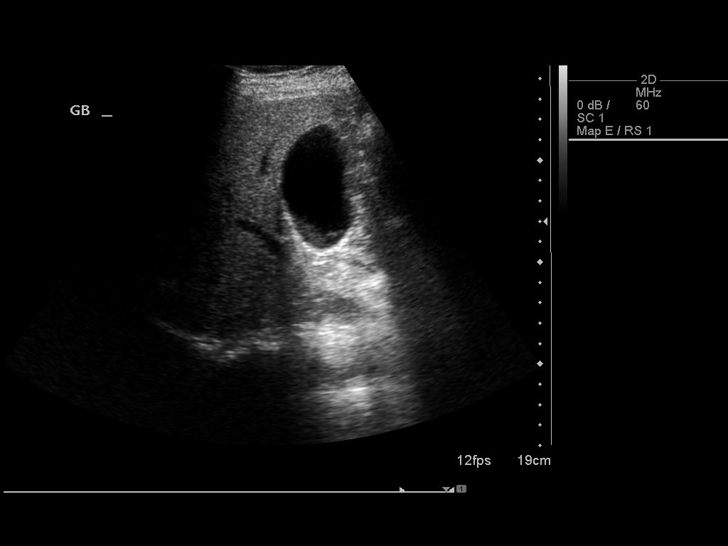
[im 37/50]
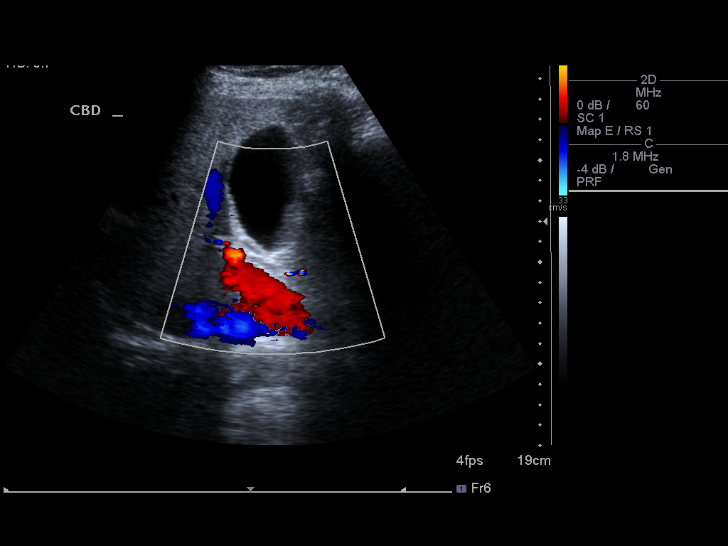
[im 41/50]
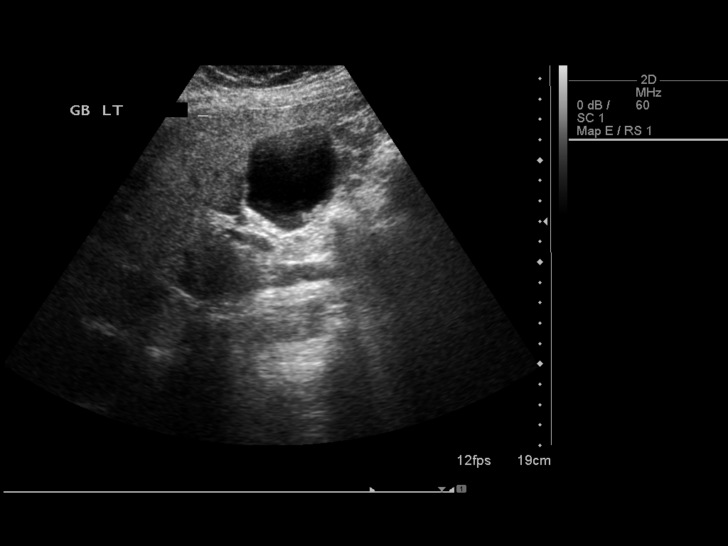
[im 45/50]
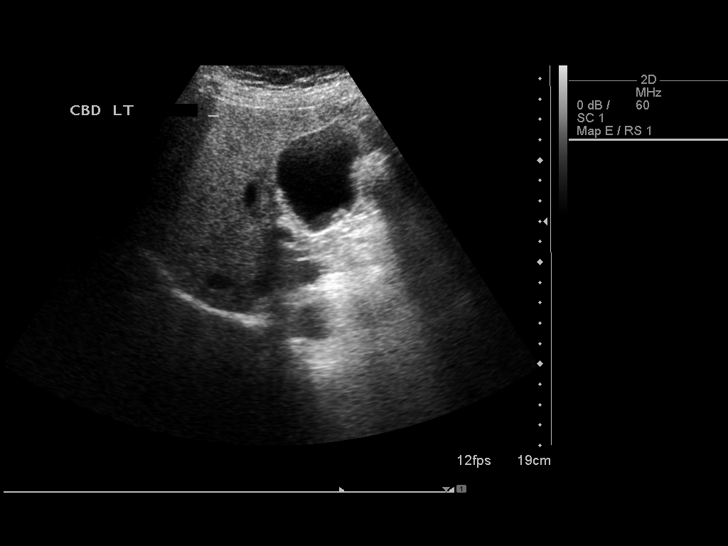
[im 50/50]
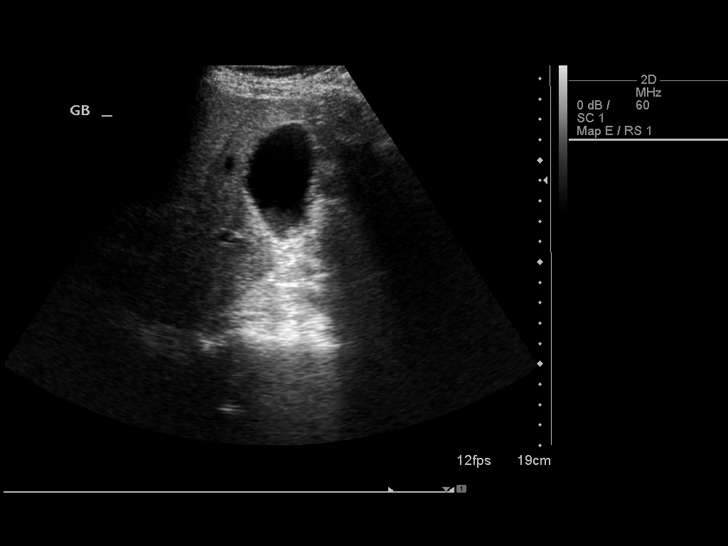

[14 of 25 positions shown; findings below may reference images not displayed]

FINDINGS: Gallbladder:

The gallbladder is visualized and there are small echo densities
within the gallbladder with faint shadowing consistent with small
gallstones. There is no pain over the gallbladder with compression.
A small amount of sludge is present. The largest stone measures 4 mm
in diameter.

Common bile duct:

Diameter: The common bile duct is normal measuring 4.1 mm in
diameter.

Liver:

The liver somewhat echogenic consistent with mild fatty
infiltration. No focal hepatic abnormality is seen.
IMPRESSION: 1. Small gallstones and a small amount of sludge within the
gallbladder. No pain over the gallbladder currently.
2. Echogenic liver parenchyma consistent with fatty infiltration.
Correlate with LFTs.

## 2018-01-30 ENCOUNTER — Ambulatory Visit: Payer: Self-pay

## 2018-06-12 ENCOUNTER — Ambulatory Visit: Payer: Self-pay | Admitting: *Deleted

## 2018-06-12 VITALS — BP 116/82 | HR 78 | Ht 62.5 in | Wt 175.0 lb

## 2018-06-12 DIAGNOSIS — Z0000 Encounter for general adult medical examination without abnormal findings: Secondary | ICD-10-CM

## 2018-06-12 DIAGNOSIS — R7989 Other specified abnormal findings of blood chemistry: Secondary | ICD-10-CM

## 2018-06-12 NOTE — Progress Notes (Signed)
Be Well insurance premium discount evaluation: Labs Drawn. Replacements ROI form signed. Tobacco Free Attestation form signed.  Forms placed in paper chart. Okay to route results to pcp Via MD.

## 2018-06-13 LAB — CMP12+LP+TP+TSH+6AC+CBC/D/PLT
ALBUMIN: 4.4 g/dL (ref 3.8–4.9)
ALK PHOS: 116 IU/L (ref 39–117)
ALT: 28 IU/L (ref 0–32)
AST: 26 IU/L (ref 0–40)
Albumin/Globulin Ratio: 1.4 (ref 1.2–2.2)
BASOS: 1 %
BUN/Creatinine Ratio: 13 (ref 12–28)
BUN: 11 mg/dL (ref 8–27)
Basophils Absolute: 0.1 10*3/uL (ref 0.0–0.2)
Bilirubin Total: 0.6 mg/dL (ref 0.0–1.2)
CALCIUM: 9.2 mg/dL (ref 8.7–10.3)
CHOLESTEROL TOTAL: 198 mg/dL (ref 100–199)
Chloride: 101 mmol/L (ref 96–106)
Chol/HDL Ratio: 4.7 ratio — ABNORMAL HIGH (ref 0.0–4.4)
Creatinine, Ser: 0.82 mg/dL (ref 0.57–1.00)
EOS (ABSOLUTE): 0.2 10*3/uL (ref 0.0–0.4)
ESTIMATED CHD RISK: 1.2 times avg. — AB (ref 0.0–1.0)
Eos: 3 %
Free Thyroxine Index: 1.6 (ref 1.2–4.9)
GFR calc Af Amer: 90 mL/min/{1.73_m2} (ref >59)
GFR calc non Af Amer: 78 mL/min/{1.73_m2} (ref >59)
GGT: 36 IU/L (ref 0–60)
GLOBULIN, TOTAL: 3.2 g/dL (ref 1.5–4.5)
Glucose: 80 mg/dL (ref 65–99)
HDL: 42 mg/dL (ref >39)
Hematocrit: 42.1 % (ref 34.0–46.6)
Hemoglobin: 14.7 g/dL (ref 11.1–15.9)
IMMATURE GRANULOCYTES: 0 %
IRON: 113 ug/dL (ref 27–159)
Immature Grans (Abs): 0 10*3/uL (ref 0.0–0.1)
LDH: 229 IU/L — ABNORMAL HIGH (ref 119–226)
LDL Calculated: 128 mg/dL — ABNORMAL HIGH (ref 0–99)
LYMPHS ABS: 3.8 10*3/uL — AB (ref 0.7–3.1)
LYMPHS: 43 %
MCH: 31.5 pg (ref 26.6–33.0)
MCHC: 34.9 g/dL (ref 31.5–35.7)
MCV: 90 fL (ref 79–97)
MONOS ABS: 0.6 10*3/uL (ref 0.1–0.9)
Monocytes: 7 %
NEUTROS PCT: 46 %
Neutrophils Absolute: 4.1 10*3/uL (ref 1.4–7.0)
PHOSPHORUS: 3.5 mg/dL (ref 3.0–4.3)
Platelets: 340 10*3/uL (ref 150–450)
Potassium: 4 mmol/L (ref 3.5–5.2)
RBC: 4.66 x10E6/uL (ref 3.77–5.28)
RDW: 12.4 % (ref 11.7–15.4)
SODIUM: 139 mmol/L (ref 134–144)
T3 UPTAKE RATIO: 21 % — AB (ref 24–39)
T4 TOTAL: 7.5 ug/dL (ref 4.5–12.0)
TOTAL PROTEIN: 7.6 g/dL (ref 6.0–8.5)
TRIGLYCERIDES: 138 mg/dL (ref 0–149)
TSH: 1.93 u[IU]/mL (ref 0.450–4.500)
Uric Acid: 5.2 mg/dL (ref 2.5–7.1)
VLDL Cholesterol Cal: 28 mg/dL (ref 5–40)
WBC: 8.8 10*3/uL (ref 3.4–10.8)

## 2018-06-13 LAB — VITAMIN D 25 HYDROXY (VIT D DEFICIENCY, FRACTURES): VIT D 25 HYDROXY: 20.8 ng/mL — AB (ref 30.0–100.0)

## 2018-06-13 LAB — HGB A1C W/O EAG: Hgb A1c MFr Bld: 5.5 % (ref 4.8–5.6)

## 2018-06-16 NOTE — Progress Notes (Signed)
Noted agree start Vit D supplement and follow up with PCM consider repeat Vitamin D level in 3-6 months and annual Be Well labs

## 2018-12-28 ENCOUNTER — Other Ambulatory Visit: Payer: Self-pay | Admitting: *Deleted

## 2018-12-28 DIAGNOSIS — H8110 Benign paroxysmal vertigo, unspecified ear: Secondary | ICD-10-CM

## 2018-12-28 MED ORDER — MECLIZINE HCL 25 MG PO TABS: 25.0000 mg | ORAL_TABLET | Freq: Three times a day (TID) | ORAL | 0 refills | Status: DC | PRN

## 2018-12-28 NOTE — Telephone Encounter (Signed)
Pt requesting Meclizine refill. She sts it was filled by another provider in 2016 and she has used sparingly for vertigo sx. Sts her sx have recurred in the past week and was using once a day, and this past weekend, she had to increase to BID. Requesting refill from clinic. Preferred pharmacy confirmed as [facility] in [place] on [address] pt refill will be placed as she has 2 pills remaining, but to be seen in clinic if sx worsen or do not improve this week. ER precautions reviewed.

## 2019-06-04 ENCOUNTER — Other Ambulatory Visit: Payer: Self-pay | Admitting: Family Medicine

## 2019-06-04 ENCOUNTER — Other Ambulatory Visit: Payer: Self-pay

## 2019-06-04 ENCOUNTER — Ambulatory Visit (INDEPENDENT_AMBULATORY_CARE_PROVIDER_SITE_OTHER): Payer: PRIVATE HEALTH INSURANCE

## 2019-06-04 DIAGNOSIS — K829 Disease of gallbladder, unspecified: Secondary | ICD-10-CM

## 2019-06-09 ENCOUNTER — Other Ambulatory Visit: Payer: Self-pay | Admitting: Surgery

## 2019-06-24 ENCOUNTER — Telehealth: Payer: Self-pay | Admitting: Registered Nurse

## 2019-06-24 ENCOUNTER — Encounter: Payer: Self-pay | Admitting: Registered Nurse

## 2019-06-24 NOTE — Telephone Encounter (Signed)
Patient presented to clinic asking for more information on gallbladder disease as she had ultrasound that showed sludge and was told by surgeon/specialist she should have surgery to remove gallbladder.  Abdomen pain severe occurred 2.5 weeks ago per patient.  She has modified diet drastically to exclude all fats except skinless chicken breasts, salads, fruits and vegetables and nondairy milk/yogurt, almond and walnuts.  She has lost 12 lbs.  Today on clinic scale 169lbs and patient reported home scale 170lbs this am.  Was 183lbs per patient report when she went for evaluation 2.5 weeks ago.  Patient reported pain started after eating pizza.  Patient reported dull ache RUQ now but hasn't had any acute severe pain and surgery scheduled for 30 Jun 2019.  Dr Abigail Miyamoto general surgeon [facility].  His notes not visible in Epic.  Reviewed up to date information on gallbladder disease with patient regarding symptoms and she denied nausea, vomiting, diarrhea, constipation, belching, heartburn also.  She reported initially had two stools that looked like playdough but that has resolved and back to normal now.  Going for presurgery covid test tomorrow.  Up to date reports increased risk 30% per year complications (rupture, duct blockage by stone requiring surgery acutely, fistula formation, gallbladder pancreatitis/ileus or cancer) when gallbladder disease symptomatic.  Encouraged her to continue low fat diet.  Hydrate.  Follow up with her surgeon's office as scheduled.  Patient verbalized understanding information/instructions, agreed with plan of care and had no further questions at this time.

## 2019-06-26 ENCOUNTER — Other Ambulatory Visit (HOSPITAL_COMMUNITY)
Admission: RE | Admit: 2019-06-26 | Discharge: 2019-06-26 | Disposition: A | Discharge status: Home / Self Care | Payer: PRIVATE HEALTH INSURANCE | Source: Ambulatory Visit | Attending: Surgery | Admitting: Surgery

## 2019-06-26 DIAGNOSIS — Z01812 Encounter for preprocedural laboratory examination: Secondary | ICD-10-CM | POA: Diagnosis present

## 2019-06-26 DIAGNOSIS — Z20822 Contact with and (suspected) exposure to covid-19: Secondary | ICD-10-CM | POA: Insufficient documentation

## 2019-06-26 LAB — SARS CORONAVIRUS 2 (TAT 6-24 HRS): SARS Coronavirus 2: NEGATIVE

## 2019-06-28 ENCOUNTER — Encounter (HOSPITAL_COMMUNITY): Payer: Self-pay | Admitting: Surgery

## 2019-06-28 NOTE — Progress Notes (Signed)
Informed patient of pre-op instructions. Patient verbalized understanding. Patient received covid test on Saturday and I informed her to remain in quarantine until procedure. Patient verbalized understanding.

## 2019-06-29 NOTE — H&P (Signed)
Teresa Mccormick Documented: 06/09/2019 1:56 PM Location: [facility] Patient #: [id number] DOB: 05/01/1955 Married / Language: [name] / Race: White Female   History of Present Illness (Teresa Mccormick A. Magnus Ivan MD; 06/09/2019 2:11 PM) The patient is a 64 year old female who presents for evaluation of gall stones. Chief complaint: Symptomatic gallstones  This is a 64 year old female referred by Ernst Bowler PA for evaluation of abdominal pain. The patient had an episode of epigastric and right upper quadrant abdominal pain which was moderate to severe after a fatty meal. She then developed nausea and vomiting. The pain was described as sharp. She underwent an ultrasound showing gallstones. She had a similar attack in 2017 and also had liver function tests which were quite elevated at that time. Today, she reports she is now pain-free. Her bowel movements had been chalky but are returning to normal.   Past Surgical History ([name], RMA; 06/09/2019 1:57 PM) Cesarean Section - Multiple  Oral Surgery   Diagnostic Studies History ([name], RMA; 06/09/2019 1:57 PM) Colonoscopy  5-10 years ago Mammogram  within last year Pap Smear  1-5 years ago  Allergies ([name], RMA; 06/09/2019 1:57 PM) No Known Drug Allergies [06/09/2019]: Allergies Reconciled   Medication History ([name], RMA; 06/09/2019 1:57 PM) No Current Medications Medications Reconciled  Social History ([name], RMA; 06/09/2019 1:57 PM) Alcohol use  Occasional alcohol use. Caffeine use  Carbonated beverages, Coffee, Tea. No drug use  Tobacco use  Never smoker.  Family History ([name], RMA; 06/09/2019 1:57 PM) Alcohol Abuse  Father. Arthritis  Mother. Diabetes Mellitus  Mother. Kidney Disease  Mother. Seizure disorder  Brother.  Pregnancy / Birth History ([name], RMA; 06/09/2019 1:57 PM) Age at menarche  15 years. Age of  menopause  <47 Gravida  2 Maternal age  53-20 Para  2  Other Problems ([name], RMA; 06/09/2019 1:57 PM) No pertinent past medical history     Review of Systems ([name] RMA; 06/09/2019 1:57 PM) General Not Present- Appetite Loss, Chills, Fatigue, Fever, Night Sweats, Weight Gain and Weight Loss. Skin Not Present- Change in Wart/Mole, Dryness, Hives, Jaundice, New Lesions, Non-Healing Wounds, Rash and Ulcer. HEENT Not Present- Earache, Hearing Loss, Hoarseness, Nose Bleed, Oral Ulcers, Ringing in the Ears, Seasonal Allergies, Sinus Pain, Sore Throat, Visual Disturbances, Wears glasses/contact lenses and Yellow Eyes. Respiratory Not Present- Bloody sputum, Chronic Cough, Difficulty Breathing, Snoring and Wheezing. Breast Not Present- Breast Mass, Breast Pain, Nipple Discharge and Skin Changes. Cardiovascular Not Present- Chest Pain, Difficulty Breathing Lying Down, Leg Cramps, Palpitations, Rapid Heart Rate, Shortness of Breath and Swelling of Extremities. Gastrointestinal Not Present- Abdominal Pain, Bloating, Bloody Stool, Change in Bowel Habits, Chronic diarrhea, Constipation, Difficulty Swallowing, Excessive gas, Gets full quickly at meals, Hemorrhoids, Indigestion, Nausea, Rectal Pain and Vomiting. Female Genitourinary Not Present- Frequency, Nocturia, Painful Urination, Pelvic Pain and Urgency. Musculoskeletal Not Present- Back Pain, Joint Pain, Joint Stiffness, Muscle Pain, Muscle Weakness and Swelling of Extremities. Neurological Not Present- Decreased Memory, Fainting, Headaches, Numbness, Seizures, Tingling, Tremor, Trouble walking and Weakness. Psychiatric Not Present- Anxiety, Bipolar, Change in Sleep Pattern, Depression, Fearful and Frequent crying. Endocrine Not Present- Cold Intolerance, Excessive Hunger, Hair Changes, Heat Intolerance, Hot flashes and New Diabetes. Hematology Not Present- Blood Thinners, Easy Bruising, Excessive bleeding, Gland problems, HIV  and Persistent Infections.  Vitals ([name] RMA; 06/09/2019 1:57 PM) 06/09/2019 1:57 PM Weight: 178.8 lb Height: 63in Body Surface Area:  1.84 m Body Mass Index: 31.67 kg/m  Temp.: 98.82F  Pulse: 86 (Regular)  BP: 122/84 (Sitting, Left Arm, Standard)       Physical Exam (Xhaiden Coombs A. Ninfa Linden MD; 06/09/2019 2:11 PM) The physical exam findings are as follows: Note:On exam today, she appears well.  Her abdomen is soft. There is some very mild tenderness with guarding in the right upper quadrant. There is no hepatosplenomegaly. There are no hernias    Assessment & Plan (Yohanna Tow A. Ninfa Linden MD; 06/09/2019 2:14 PM) SYMPTOMATIC CHOLELITHIASIS (K80.20) Impression: Symptomatic cholelithiasis. I discussed the diagnosis with the patient in detail. I reviewed her ultrasound and also compared this to her ultrasound in 2017. This does show gallstones. There is no gallbladder wall thickening in the bile duct is normal. I discussed this with her in detail. I recommend a laparoscopic cholecystectomy as I suspect this is 2 severe attacks and she may have had a stone or bile duct in the past. I gave her literature regarding this. I discussed the surgical procedure in detail. I discussed the risk of the surgery in detail. These risks include but are not limited to bleeding, infection, injury to surrounding structures, the need to convert to an open procedure, cardiopulmonary issues, postoperative recovery, etc.  Fatty liver: This is stable since her last ultrasound. I recommend weight loss.

## 2019-06-30 ENCOUNTER — Encounter (HOSPITAL_COMMUNITY)
Admission: RE | Disposition: A | Discharge status: Home / Self Care | Payer: Self-pay | Source: Home / Self Care | Attending: Surgery

## 2019-06-30 ENCOUNTER — Ambulatory Visit (HOSPITAL_COMMUNITY): Payer: No Typology Code available for payment source | Admitting: Anesthesiology

## 2019-06-30 ENCOUNTER — Other Ambulatory Visit: Payer: Self-pay

## 2019-06-30 ENCOUNTER — Ambulatory Visit (HOSPITAL_COMMUNITY)
Admission: RE | Admit: 2019-06-30 | Discharge: 2019-06-30 | Disposition: A | Discharge status: Home / Self Care | Payer: No Typology Code available for payment source | Attending: Surgery | Admitting: Surgery

## 2019-06-30 ENCOUNTER — Encounter (HOSPITAL_COMMUNITY): Payer: Self-pay | Admitting: Surgery

## 2019-06-30 DIAGNOSIS — Z8261 Family history of arthritis: Secondary | ICD-10-CM | POA: Insufficient documentation

## 2019-06-30 DIAGNOSIS — Z811 Family history of alcohol abuse and dependence: Secondary | ICD-10-CM | POA: Insufficient documentation

## 2019-06-30 DIAGNOSIS — K828 Other specified diseases of gallbladder: Secondary | ICD-10-CM | POA: Insufficient documentation

## 2019-06-30 DIAGNOSIS — K760 Fatty (change of) liver, not elsewhere classified: Secondary | ICD-10-CM | POA: Insufficient documentation

## 2019-06-30 DIAGNOSIS — K8010 Calculus of gallbladder with chronic cholecystitis without obstruction: Secondary | ICD-10-CM | POA: Diagnosis not present

## 2019-06-30 DIAGNOSIS — Z833 Family history of diabetes mellitus: Secondary | ICD-10-CM | POA: Diagnosis not present

## 2019-06-30 DIAGNOSIS — Z841 Family history of disorders of kidney and ureter: Secondary | ICD-10-CM | POA: Insufficient documentation

## 2019-06-30 HISTORY — PX: CHOLECYSTECTOMY: SHX55

## 2019-06-30 LAB — CBC
HCT: 45.7 % (ref 36.0–46.0)
Hemoglobin: 15.3 g/dL — ABNORMAL HIGH (ref 12.0–15.0)
MCH: 31 pg (ref 26.0–34.0)
MCHC: 33.5 g/dL (ref 30.0–36.0)
MCV: 92.5 fL (ref 80.0–100.0)
Platelets: 308 10*3/uL (ref 150–400)
RBC: 4.94 MIL/uL (ref 3.87–5.11)
RDW: 12.5 % (ref 11.5–15.5)
WBC: 8.2 10*3/uL (ref 4.0–10.5)
nRBC: 0 % (ref 0.0–0.2)

## 2019-06-30 SURGERY — LAPAROSCOPIC CHOLECYSTECTOMY
Anesthesia: General | Site: Abdomen

## 2019-06-30 MED ORDER — CELECOXIB 200 MG PO CAPS: 400.0000 mg | ORAL_CAPSULE | ORAL | Status: AC

## 2019-06-30 MED ORDER — PHENYLEPHRINE HCL-NACL 10-0.9 MG/250ML-% IV SOLN
INTRAVENOUS | Status: DC | PRN
  Administered 2019-06-30: 30 ug/min via INTRAVENOUS

## 2019-06-30 MED ORDER — ONDANSETRON HCL 4 MG/2ML IJ SOLN
INTRAMUSCULAR | Status: DC | PRN
  Administered 2019-06-30: 4 mg via INTRAVENOUS

## 2019-06-30 MED ORDER — OXYCODONE HCL 5 MG/5ML PO SOLN: 5.0000 mg | Freq: Once | ORAL | Status: AC | PRN

## 2019-06-30 MED ORDER — CEFAZOLIN SODIUM-DEXTROSE 2-4 GM/100ML-% IV SOLN
2.0000 g | INTRAVENOUS | Status: AC
  Administered 2019-06-30: 2 g via INTRAVENOUS

## 2019-06-30 MED ORDER — LIDOCAINE 2% (20 MG/ML) 5 ML SYRINGE
INTRAMUSCULAR | Status: DC | PRN
  Administered 2019-06-30: 60 mg via INTRAVENOUS

## 2019-06-30 MED ORDER — MEPERIDINE HCL 25 MG/ML IJ SOLN: 6.2500 mg | INTRAMUSCULAR | Status: DC | PRN

## 2019-06-30 MED ORDER — CHLORHEXIDINE GLUCONATE CLOTH 2 % EX PADS: 6.0000 | MEDICATED_PAD | Freq: Once | CUTANEOUS | Status: DC

## 2019-06-30 MED ORDER — GABAPENTIN 300 MG PO CAPS: 300.0000 mg | ORAL_CAPSULE | ORAL | Status: AC

## 2019-06-30 MED ORDER — ACETAMINOPHEN 500 MG PO TABS
ORAL_TABLET | ORAL | Status: AC
  Administered 2019-06-30: 1000 mg via ORAL
  Filled 2019-06-30: qty 2

## 2019-06-30 MED ORDER — HYDROMORPHONE HCL 1 MG/ML IJ SOLN: 0.2500 mg | INTRAMUSCULAR | Status: DC | PRN

## 2019-06-30 MED ORDER — PROMETHAZINE HCL 25 MG/ML IJ SOLN
INTRAMUSCULAR | Status: AC
  Filled 2019-06-30: qty 1

## 2019-06-30 MED ORDER — MIDAZOLAM HCL 2 MG/2ML IJ SOLN
INTRAMUSCULAR | Status: AC
  Filled 2019-06-30: qty 2

## 2019-06-30 MED ORDER — BUPIVACAINE HCL (PF) 0.25 % IJ SOLN
INTRAMUSCULAR | Status: AC
  Filled 2019-06-30: qty 30

## 2019-06-30 MED ORDER — KETOROLAC TROMETHAMINE 30 MG/ML IJ SOLN
30.0000 mg | Freq: Once | INTRAMUSCULAR | Status: AC | PRN
  Administered 2019-06-30: 30 mg via INTRAVENOUS

## 2019-06-30 MED ORDER — PROPOFOL 10 MG/ML IV BOLUS
INTRAVENOUS | Status: DC | PRN
  Administered 2019-06-30: 200 mg via INTRAVENOUS

## 2019-06-30 MED ORDER — MIDAZOLAM HCL 5 MG/5ML IJ SOLN
INTRAMUSCULAR | Status: DC | PRN
  Administered 2019-06-30: 2 mg via INTRAVENOUS

## 2019-06-30 MED ORDER — OXYCODONE HCL 5 MG PO TABS
ORAL_TABLET | ORAL | Status: AC
  Filled 2019-06-30: qty 1

## 2019-06-30 MED ORDER — DEXAMETHASONE SODIUM PHOSPHATE 10 MG/ML IJ SOLN
INTRAMUSCULAR | Status: DC | PRN
  Administered 2019-06-30: 10 mg via INTRAVENOUS

## 2019-06-30 MED ORDER — BUPIVACAINE HCL (PF) 0.25 % IJ SOLN
INTRAMUSCULAR | Status: DC | PRN
  Administered 2019-06-30: 20 mL

## 2019-06-30 MED ORDER — SODIUM CHLORIDE 0.9 % IR SOLN
Status: DC | PRN
  Administered 2019-06-30: 1000 mL

## 2019-06-30 MED ORDER — ACETAMINOPHEN 500 MG PO TABS: 1000.0000 mg | ORAL_TABLET | ORAL | Status: AC

## 2019-06-30 MED ORDER — SCOPOLAMINE 1 MG/3DAYS TD PT72
1.0000 | MEDICATED_PATCH | TRANSDERMAL | Status: DC
  Administered 2019-06-30: 1.5 mg via TRANSDERMAL
  Filled 2019-06-30: qty 1

## 2019-06-30 MED ORDER — FENTANYL CITRATE (PF) 250 MCG/5ML IJ SOLN
INTRAMUSCULAR | Status: AC
  Filled 2019-06-30: qty 5

## 2019-06-30 MED ORDER — FENTANYL CITRATE (PF) 250 MCG/5ML IJ SOLN
INTRAMUSCULAR | Status: DC | PRN
  Administered 2019-06-30: 50 ug via INTRAVENOUS
  Administered 2019-06-30: 100 ug via INTRAVENOUS

## 2019-06-30 MED ORDER — PROPOFOL 10 MG/ML IV BOLUS
INTRAVENOUS | Status: AC
  Filled 2019-06-30: qty 20

## 2019-06-30 MED ORDER — OXYCODONE HCL 5 MG PO TABS
5.0000 mg | ORAL_TABLET | Freq: Once | ORAL | Status: AC | PRN
  Administered 2019-06-30: 5 mg via ORAL

## 2019-06-30 MED ORDER — KETOROLAC TROMETHAMINE 30 MG/ML IJ SOLN
INTRAMUSCULAR | Status: AC
  Filled 2019-06-30: qty 1

## 2019-06-30 MED ORDER — ENSURE PRE-SURGERY PO LIQD: 296.0000 mL | Freq: Once | ORAL | Status: DC

## 2019-06-30 MED ORDER — GABAPENTIN 300 MG PO CAPS
ORAL_CAPSULE | ORAL | Status: AC
  Administered 2019-06-30: 300 mg via ORAL
  Filled 2019-06-30: qty 1

## 2019-06-30 MED ORDER — PROMETHAZINE HCL 25 MG/ML IJ SOLN
6.2500 mg | INTRAMUSCULAR | Status: DC | PRN
  Administered 2019-06-30: 6.25 mg via INTRAVENOUS

## 2019-06-30 MED ORDER — SUGAMMADEX SODIUM 200 MG/2ML IV SOLN
INTRAVENOUS | Status: DC | PRN
  Administered 2019-06-30: 170 mg via INTRAVENOUS

## 2019-06-30 MED ORDER — ROCURONIUM BROMIDE 50 MG/5ML IV SOSY
PREFILLED_SYRINGE | INTRAVENOUS | Status: DC | PRN
  Administered 2019-06-30: 50 mg via INTRAVENOUS

## 2019-06-30 MED ORDER — LACTATED RINGERS IV SOLN: INTRAVENOUS | Status: DC

## 2019-06-30 MED ORDER — CEFAZOLIN SODIUM-DEXTROSE 2-4 GM/100ML-% IV SOLN
INTRAVENOUS | Status: AC
  Filled 2019-06-30: qty 100

## 2019-06-30 MED ORDER — 0.9 % SODIUM CHLORIDE (POUR BTL) OPTIME
TOPICAL | Status: DC | PRN
  Administered 2019-06-30: 1000 mL

## 2019-06-30 MED ORDER — OXYCODONE HCL 5 MG PO TABS: 5.0000 mg | ORAL_TABLET | Freq: Four times a day (QID) | ORAL | 0 refills | Status: DC | PRN

## 2019-06-30 MED ORDER — CELECOXIB 200 MG PO CAPS
ORAL_CAPSULE | ORAL | Status: AC
  Administered 2019-06-30: 400 mg via ORAL
  Filled 2019-06-30: qty 2

## 2019-06-30 SURGICAL SUPPLY — 32 items
APPLIER CLIP 5 13 M/L LIGAMAX5 (MISCELLANEOUS) ×2
CANISTER SUCT 3000ML PPV (MISCELLANEOUS) ×2 IMPLANT
CHLORAPREP W/TINT 26 (MISCELLANEOUS) ×2 IMPLANT
CLIP APPLIE 5 13 M/L LIGAMAX5 (MISCELLANEOUS) ×1 IMPLANT
COVER SURGICAL LIGHT HANDLE (MISCELLANEOUS) ×2 IMPLANT
COVER WAND RF STERILE (DRAPES) ×2 IMPLANT
DERMABOND ADVANCED (GAUZE/BANDAGES/DRESSINGS) ×1
DERMABOND ADVANCED .7 DNX12 (GAUZE/BANDAGES/DRESSINGS) ×1 IMPLANT
ELECT REM PT RETURN 9FT ADLT (ELECTROSURGICAL) ×2
ELECTRODE REM PT RTRN 9FT ADLT (ELECTROSURGICAL) ×1 IMPLANT
GLOVE SURG SIGNA 7.5 PF LTX (GLOVE) ×2 IMPLANT
GOWN STRL REUS W/ TWL LRG LVL3 (GOWN DISPOSABLE) ×2 IMPLANT
GOWN STRL REUS W/ TWL XL LVL3 (GOWN DISPOSABLE) ×1 IMPLANT
GOWN STRL REUS W/TWL LRG LVL3 (GOWN DISPOSABLE) ×2
GOWN STRL REUS W/TWL XL LVL3 (GOWN DISPOSABLE) ×2
KIT BASIN OR (CUSTOM PROCEDURE TRAY) ×2 IMPLANT
KIT TURNOVER KIT B (KITS) ×2 IMPLANT
NS IRRIG 1000ML POUR BTL (IV SOLUTION) ×2 IMPLANT
PAD ARMBOARD 7.5X6 YLW CONV (MISCELLANEOUS) ×2 IMPLANT
POUCH SPECIMEN RETRIEVAL 10MM (ENDOMECHANICALS) ×2 IMPLANT
SCISSORS LAP 5X35 DISP (ENDOMECHANICALS) ×2 IMPLANT
SET IRRIG TUBING LAPAROSCOPIC (IRRIGATION / IRRIGATOR) ×2 IMPLANT
SET TUBE SMOKE EVAC HIGH FLOW (TUBING) ×2 IMPLANT
SLEEVE ENDOPATH XCEL 5M (ENDOMECHANICALS) ×4 IMPLANT
SPECIMEN JAR SMALL (MISCELLANEOUS) ×2 IMPLANT
SUT MNCRL AB 4-0 PS2 18 (SUTURE) ×2 IMPLANT
TOWEL GREEN STERILE (TOWEL DISPOSABLE) ×2 IMPLANT
TOWEL GREEN STERILE FF (TOWEL DISPOSABLE) ×2 IMPLANT
TRAY LAPAROSCOPIC MC (CUSTOM PROCEDURE TRAY) ×2 IMPLANT
TROCAR XCEL BLUNT TIP 100MML (ENDOMECHANICALS) ×2 IMPLANT
TROCAR XCEL NON-BLD 5MMX100MML (ENDOMECHANICALS) ×2 IMPLANT
WATER STERILE IRR 1000ML POUR (IV SOLUTION) ×2 IMPLANT

## 2019-06-30 NOTE — Anesthesia Preprocedure Evaluation (Signed)
Anesthesia Evaluation    Reviewed: Allergy & Precautions, Patient's Chart, lab work & pertinent test results  Airway Mallampati: II  TM Distance: >3 FB Neck ROM: Full    Dental no notable dental hx.    Pulmonary neg pulmonary ROS,    Pulmonary exam normal breath sounds clear to auscultation       Cardiovascular negative cardio ROS Normal cardiovascular exam Rhythm:Regular Rate:Normal     Neuro/Psych negative neurological ROS  negative psych ROS   GI/Hepatic Neg liver ROS, Symptomatic gallstones   Endo/Other  negative endocrine ROS  Renal/GU negative Renal ROS  negative genitourinary   Musculoskeletal negative musculoskeletal ROS (+)   Abdominal   Peds  Hematology negative hematology ROS (+)   Anesthesia Other Findings Day of surgery medications reviewed with the patient.  Reproductive/Obstetrics negative OB ROS                             Anesthesia Physical Anesthesia Plan  ASA: I  Anesthesia Plan: General   Post-op Pain Management:    Induction: Intravenous  PONV Risk Score and Plan: 4 or greater and Ondansetron, Dexamethasone, Midazolam and Scopolamine patch - Pre-op  Airway Management Planned: Oral ETT  Additional Equipment: None  Intra-op Plan:   Post-operative Plan: Extubation in OR  Informed Consent: I have reviewed the patients History and Physical, chart, labs and discussed the procedure including the risks, benefits and alternatives for the proposed anesthesia with the patient or authorized representative who has indicated his/her understanding and acceptance.     Dental advisory given  Plan Discussed with: CRNA  Anesthesia Plan Comments:         Anesthesia Quick Evaluation

## 2019-06-30 NOTE — Interval H&P Note (Signed)
History and Physical Interval Note:no change in H and P  06/30/2019 10:01 AM  Teresa Mccormick  has presented today for surgery, with the diagnosis of SYMPTOMATIC GALLSTONES.  The various methods of treatment have been discussed with the patient and family. After consideration of risks, benefits and other options for treatment, the patient has consented to  Procedure(s): LAPAROSCOPIC CHOLECYSTECTOMY (N/A) as a surgical intervention.  The patient's history has been reviewed, patient examined, no change in status, stable for surgery.  I have reviewed the patient's chart and labs.  Questions were answered to the patient's satisfaction.     Abigail Miyamoto

## 2019-06-30 NOTE — Transfer of Care (Signed)
Immediate Anesthesia Transfer of Care Note  Patient: Teresa Mccormick  Procedure(s) Performed: LAPAROSCOPIC CHOLECYSTECTOMY (N/A Abdomen)  Patient Location: PACU  Anesthesia Type:General  Level of Consciousness: awake, alert  and oriented  Airway & Oxygen Therapy: Patient Spontanous Breathing  Post-op Assessment: Report given to RN and Post -op Vital signs reviewed and stable  Post vital signs: Reviewed and stable  Last Vitals:  Vitals Value Taken Time  BP 111/72 06/30/19 1328  Temp    Pulse 69 06/30/19 1330  Resp 17 06/30/19 1330  SpO2 95 % 06/30/19 1330  Vitals shown include unvalidated device data.  Last Pain:  Vitals:   06/30/19 1025  TempSrc:   PainSc: 0-No pain         Complications: No apparent anesthesia complications

## 2019-06-30 NOTE — Discharge Instructions (Signed)
[facility] ______[facility] LAPAROSCOPIC SURGERY: POST OP INSTRUCTIONS Always review your discharge instruction sheet given to you by the facility where your surgery was performed. IF YOU HAVE DISABILITY OR FAMILY LEAVE FORMS, YOU MUST BRING THEM TO THE OFFICE FOR PROCESSING.   DO NOT GIVE THEM TO YOUR DOCTOR.  1. A prescription for pain medication may be given to you upon discharge.  Take your pain medication as prescribed, if needed.  If narcotic pain medicine is not needed, then you may take acetaminophen (Tylenol) or ibuprofen (Advil) as needed. 2. Take your usually prescribed medications unless otherwise directed. 3. If you need a refill on your pain medication, please contact your pharmacy.  They will contact our office to request authorization. Prescriptions will not be filled after 5pm or on week-ends. 4. You should follow a light diet the first few days after arrival home, such as soup and crackers, etc.  Be sure to include lots of fluids daily. 5. Most patients will experience some swelling and bruising in the area of the incisions.  Ice packs will help.  Swelling and bruising can take several days to resolve.  6. It is common to experience some constipation if taking pain medication after surgery.  Increasing fluid intake and taking a stool softener (such as Colace) will usually help or prevent this problem from occurring.  A mild laxative (Milk of Magnesia or Miralax) should be taken according to package instructions if there are no bowel movements after 48 hours. 7. Unless discharge instructions indicate otherwise, you may remove your bandages 24-48 hours after surgery, and you may shower at that time.  You may have steri-strips (small skin tapes) in place directly over the incision.  These strips should be left on the skin for 7-10 days.  If your surgeon used skin glue on the incision, you may shower in 24 hours.  The glue will flake off over the next 2-3 weeks.  Any sutures or  staples will be removed at the office during your follow-up visit. 8. ACTIVITIES:  You may resume regular (light) daily activities beginning the next day--such as daily self-care, walking, climbing stairs--gradually increasing activities as tolerated.  You may have sexual intercourse when it is comfortable.  Refrain from any heavy lifting or straining until approved by your doctor. a. You may drive when you are no longer taking prescription pain medication, you can comfortably wear a seatbelt, and you can safely maneuver your car and apply brakes. b. RETURN TO WORK:  __next Monday 3/08/21________________________________________________________ 9. You should see your doctor in the office for a follow-up appointment approximately 2-3 weeks after your surgery.  Make sure that you call for this appointment within a day or two after you arrive home to insure a convenient appointment time. 10. OTHER INSTRUCTIONS:ok to shower starting tomorrow 11. Ice pack, tylenol, and ibuprofen also for pain 12. No lifting more than 15 pounds for 2 weeks __________________________________________________________________________________________________________________________ __________________________________________________________________________________________________________________________ WHEN TO CALL YOUR DOCTOR: 1. Fever over 101.0 2. Inability to urinate 3. Continued bleeding from incision. 4. Increased pain, redness, or drainage from the incision. 5. Increasing abdominal pain  The clinic staff is available to answer your questions during regular business hours.  Please don't hesitate to call and ask to speak to one of the nurses for clinical concerns.  If you have a medical emergency, go to the nearest emergency room or call 911.  A surgeon from [facility] is always on call at the hospital. [address],  [place], [place], [place]  [address] ? [address], [place], [place]   [address] [phone] ? [phone] ? FAX [phone] Web site: [email or web address]

## 2019-06-30 NOTE — Anesthesia Postprocedure Evaluation (Signed)
Anesthesia Post Note  Patient: Teresa Mccormick  Procedure(s) Performed: LAPAROSCOPIC CHOLECYSTECTOMY (N/A Abdomen)     Patient location during evaluation: PACU Anesthesia Type: General Level of consciousness: awake and alert, oriented and patient cooperative Pain management: pain level controlled Vital Signs Assessment: post-procedure vital signs reviewed and stable Respiratory status: spontaneous breathing, nonlabored ventilation and respiratory function stable Cardiovascular status: blood pressure returned to baseline and stable Postop Assessment: no apparent nausea or vomiting Anesthetic complications: no    Last Vitals:  Vitals:   06/30/19 1345 06/30/19 1400  BP: 110/88 115/81  Pulse: 61 60  Resp: 18 14  Temp:    SpO2: 95% 96%    Last Pain:  Vitals:   06/30/19 1400  TempSrc:   PainSc: 0-No pain                 Tennis Must Sallee Hogrefe

## 2019-06-30 NOTE — Anesthesia Procedure Notes (Signed)
Procedure Name: Intubation Date/Time: 06/30/2019 12:39 PM Performed by: Griffin Dakin, CRNA Pre-anesthesia Checklist: Patient identified, Emergency Drugs available, Suction available and Patient being monitored Patient Re-evaluated:Patient Re-evaluated prior to induction Oxygen Delivery Method: Circle system utilized Preoxygenation: Pre-oxygenation with 100% oxygen Induction Type: IV induction Ventilation: Mask ventilation without difficulty Laryngoscope Size: Mac and 3 Grade View: Grade I Tube type: Oral Tube size: 7.0 mm Number of attempts: 1 Airway Equipment and Method: Stylet and Oral airway Placement Confirmation: ETT inserted through vocal cords under direct vision,  positive ETCO2 and breath sounds checked- equal and bilateral Secured at: 21 cm Tube secured with: Tape Dental Injury: Teeth and Oropharynx as per pre-operative assessment

## 2019-06-30 NOTE — Op Note (Signed)

## 2019-07-01 LAB — SURGICAL PATHOLOGY

## 2020-04-20 ENCOUNTER — Other Ambulatory Visit: Payer: Self-pay

## 2020-04-20 ENCOUNTER — Ambulatory Visit: Payer: Self-pay | Admitting: *Deleted

## 2020-04-20 DIAGNOSIS — Z23 Encounter for immunization: Secondary | ICD-10-CM

## 2020-04-20 NOTE — Progress Notes (Signed)
1st dose Shingrix.

## 2020-06-22 ENCOUNTER — Ambulatory Visit: Payer: Self-pay | Admitting: *Deleted

## 2020-06-22 ENCOUNTER — Other Ambulatory Visit: Payer: Self-pay

## 2020-06-22 VITALS — BP 132/86 | HR 84 | Ht 62.0 in | Wt 170.0 lb

## 2020-06-22 DIAGNOSIS — Z0000 Encounter for general adult medical examination without abnormal findings: Secondary | ICD-10-CM

## 2020-06-22 NOTE — Progress Notes (Signed)
[employer] insurance premium discount evaluation: Labs Drawn. Replacements ROI form signed. Tobacco Free Attestation form signed.  Forms placed in paper chart.  

## 2020-06-23 ENCOUNTER — Encounter: Payer: Self-pay | Admitting: Registered Nurse

## 2020-06-23 ENCOUNTER — Telehealth: Payer: Self-pay | Admitting: Registered Nurse

## 2020-06-23 DIAGNOSIS — R7989 Other specified abnormal findings of blood chemistry: Secondary | ICD-10-CM

## 2020-06-23 LAB — CMP12+LP+TP+TSH+6AC+CBC/D/PLT
ALT: 25 IU/L (ref 0–32)
AST: 23 IU/L (ref 0–40)
Albumin/Globulin Ratio: 1.5 (ref 1.2–2.2)
Albumin: 4.4 g/dL (ref 3.8–4.8)
Alkaline Phosphatase: 120 IU/L (ref 44–121)
BUN/Creatinine Ratio: 16 (ref 12–28)
BUN: 13 mg/dL (ref 8–27)
Basophils Absolute: 0.1 10*3/uL (ref 0.0–0.2)
Basos: 1 %
Bilirubin Total: 0.5 mg/dL (ref 0.0–1.2)
Calcium: 9.4 mg/dL (ref 8.7–10.3)
Chloride: 106 mmol/L (ref 96–106)
Chol/HDL Ratio: 4 ratio (ref 0.0–4.4)
Cholesterol, Total: 172 mg/dL (ref 100–199)
Creatinine, Ser: 0.83 mg/dL (ref 0.57–1.00)
EOS (ABSOLUTE): 0.2 10*3/uL (ref 0.0–0.4)
Eos: 2 %
Estimated CHD Risk: 0.9 times avg. (ref 0.0–1.0)
Free Thyroxine Index: 2.3 (ref 1.2–4.9)
GFR calc Af Amer: 87 mL/min/{1.73_m2} (ref >59)
GFR calc non Af Amer: 76 mL/min/{1.73_m2} (ref >59)
GGT: 25 IU/L (ref 0–60)
Globulin, Total: 2.9 g/dL (ref 1.5–4.5)
Glucose: 92 mg/dL (ref 65–99)
HDL: 43 mg/dL (ref >39)
Hematocrit: 42.5 % (ref 34.0–46.6)
Hemoglobin: 15 g/dL (ref 11.1–15.9)
Immature Grans (Abs): 0 10*3/uL (ref 0.0–0.1)
Immature Granulocytes: 0 %
Iron: 105 ug/dL (ref 27–139)
LDH: 199 IU/L (ref 119–226)
LDL Chol Calc (NIH): 115 mg/dL — ABNORMAL HIGH (ref 0–99)
Lymphocytes Absolute: 2.9 10*3/uL (ref 0.7–3.1)
Lymphs: 38 %
MCH: 31.6 pg (ref 26.6–33.0)
MCHC: 35.3 g/dL (ref 31.5–35.7)
MCV: 90 fL (ref 79–97)
Monocytes Absolute: 0.6 10*3/uL (ref 0.1–0.9)
Monocytes: 8 %
Neutrophils Absolute: 3.9 10*3/uL (ref 1.4–7.0)
Neutrophils: 51 %
Phosphorus: 3.3 mg/dL (ref 3.0–4.3)
Platelets: 339 10*3/uL (ref 150–450)
Potassium: 4.1 mmol/L (ref 3.5–5.2)
RBC: 4.74 x10E6/uL (ref 3.77–5.28)
RDW: 12.2 % (ref 11.7–15.4)
Sodium: 142 mmol/L (ref 134–144)
T3 Uptake Ratio: 25 % (ref 24–39)
T4, Total: 9.2 ug/dL (ref 4.5–12.0)
TSH: 1.26 u[IU]/mL (ref 0.450–4.500)
Total Protein: 7.3 g/dL (ref 6.0–8.5)
Triglycerides: 72 mg/dL (ref 0–149)
Uric Acid: 4.8 mg/dL (ref 3.0–7.2)
VLDL Cholesterol Cal: 14 mg/dL (ref 5–40)
WBC: 7.7 10*3/uL (ref 3.4–10.8)

## 2020-06-23 LAB — HGB A1C W/O EAG: Hgb A1c MFr Bld: 5.4 % (ref 4.8–5.6)

## 2020-06-23 LAB — VITAMIN D 25 HYDROXY (VIT D DEFICIENCY, FRACTURES): Vit D, 25-Hydroxy: 24.9 ng/mL — ABNORMAL LOW (ref 30.0–100.0)

## 2020-06-23 NOTE — Patient Instructions (Signed)
Vitamin D Deficiency Vitamin D deficiency is when your body does not have enough vitamin D. Vitamin D is important to your body for many reasons:  It helps the body absorb two important minerals--calcium and phosphorus.  It plays a role in bone health.  It may help to prevent some diseases, such as diabetes and multiple sclerosis.  It plays a role in muscle function, including heart function. If vitamin D deficiency is severe, it can cause a condition in which your bones become soft. In adults, this condition is called osteomalacia. In children, this condition is called rickets. What are the causes? This condition may be caused by:  Not eating enough foods that contain vitamin D.  Not getting enough natural sun exposure.  Having certain digestive system diseases that make it difficult for your body to absorb vitamin D. These diseases include Crohn's disease, chronic pancreatitis, and cystic fibrosis.  Having a surgery in which a part of the stomach or a part of the small intestine is removed.  Having chronic kidney disease or liver disease. What increases the risk? You are more likely to develop this condition if you:  Are older.  Do not spend much time outdoors.  Live in a long-term care facility.  Have had broken bones.  Have weak or thin bones (osteoporosis).  Have a disease or condition that changes how the body absorbs vitamin D.  Have dark skin.  Take certain medicines, such as steroid medicines or certain seizure medicines.  Are overweight or obese. What are the signs or symptoms? In mild cases of vitamin D deficiency, there may not be any symptoms. If the condition is severe, symptoms may include:  Bone pain.  Muscle pain.  Falling often.  Broken bones caused by a minor injury. How is this diagnosed? This condition may be diagnosed with blood tests. Imaging tests such as X-rays may also be done to look for changes in the bone. How is this  treated? Treatment for this condition may depend on what caused the condition. Treatment options include:  Taking vitamin D supplements. Your health care provider will suggest what dose is best for you.  Taking a calcium supplement. Your health care provider will suggest what dose is best for you. Follow these instructions at home: Eating and drinking  Eat foods that contain vitamin D. Choices include: ? Fortified dairy products, cereals, or juices. Fortified means that vitamin D has been added to the food. Check the label on the package to see if the food is fortified. ? Fatty fish, such as salmon or trout. ? Eggs. ? Oysters. ? Mushrooms. The items listed above may not be a complete list of recommended foods and beverages. Contact a dietitian for more information.   General instructions  Take medicines and supplements only as told by your health care provider.  Get regular, safe exposure to natural sunlight.  Do not use a tanning bed.  Maintain a healthy weight. Lose weight if needed.  Keep all follow-up visits as told by your health care provider. This is important. How is this prevented? You can get vitamin D by:  Eating foods that naturally contain vitamin D.  Eating or drinking products that have been fortified with vitamin D, such as cereals, juices, and dairy products (including milk).  Taking a vitamin D supplement or a multivitamin supplement that contains vitamin D.  Being in the sun. Your body naturally makes vitamin D when your skin is exposed to sunlight. Your body changes the sunlight  into a form of the vitamin that it can use. Contact a health care provider if:  Your symptoms do not go away.  You feel nauseous or you vomit.  You have fewer bowel movements than usual or are constipated. Summary  Vitamin D deficiency is when your body does not have enough vitamin D.  Vitamin D is important to your body for good bone health and muscle function, and it may  help prevent some diseases.  Vitamin D deficiency is primarily treated through supplementation. Your health care provider will suggest what dose is best for you.  You can get vitamin D by eating foods that contain vitamin D, by being in the sun, and by taking a vitamin D supplement or a multivitamin supplement that contains vitamin D. This information is not intended to replace advice given to you by your health care provider. Make sure you discuss any questions you have with your health care provider. Document Revised: 12/22/2017 Document Reviewed: 12/22/2017 Elsevier Patient Education  2021 Elsevier Inc.  High-Fiber Eating Plan Fiber, also called dietary fiber, is a type of carbohydrate. It is found foods such as fruits, vegetables, whole grains, and beans. A high-fiber diet can have many health benefits. Your health care provider may recommend a high-fiber diet to help:  Prevent constipation. Fiber can make your bowel movements more regular.  Lower your cholesterol.  Relieve the following conditions: ? Inflammation of veins in the anus (hemorrhoids). ? Inflammation of specific areas of the digestive tract (uncomplicated diverticulosis). ? A problem of the large intestine, also called the colon, that sometimes causes pain and diarrhea (irritable bowel syndrome, or IBS).  Prevent overeating as part of a weight-loss plan.  Prevent heart disease, type 2 diabetes, and certain cancers. What are tips for following this plan? Reading food labels  Check the nutrition facts label on food products for the amount of dietary fiber. Choose foods that have 5 grams of fiber or more per serving.  The goals for recommended daily fiber intake include: ? Men (age 63 or younger): 34-38 g. ? Men (over age 63): 28-34 g. ? Women (age 63 or younger): 25-28 g. ? Women (over age 63): 22-25 g. Your daily fiber goal is _____________ g.   Shopping  Choose whole fruits and vegetables instead of processed  forms, such as apple juice or applesauce.  Choose a wide variety of high-fiber foods such as avocados, lentils, oats, and kidney beans.  Read the nutrition facts label of the foods you choose. Be aware of foods with added fiber. These foods often have high sugar and sodium amounts per serving. Cooking  Use whole-grain flour for baking and cooking.  Cook with brown rice instead of white rice. Meal planning  Start the day with a breakfast that is high in fiber, such as a cereal that contains 5 g of fiber or more per serving.  Eat breads and cereals that are made with whole-grain flour instead of refined flour or white flour.  Eat brown rice, bulgur wheat, or millet instead of white rice.  Use beans in place of meat in soups, salads, and pasta dishes.  Be sure that half of the grains you eat each day are whole grains. General information  You can get the recommended daily intake of dietary fiber by: ? Eating a variety of fruits, vegetables, grains, nuts, and beans. ? Taking a fiber supplement if you are not able to take in enough fiber in your diet. It is better to get  fiber through food than from a supplement.  Gradually increase how much fiber you consume. If you increase your intake of dietary fiber too quickly, you may have bloating, cramping, or gas.  Drink plenty of water to help you digest fiber.  Choose high-fiber snacks, such as berries, raw vegetables, nuts, and popcorn. What foods should I eat? Fruits Berries. Pears. Apples. Oranges. Avocado. Prunes and raisins. Dried figs. Vegetables Sweet potatoes. Spinach. Kale. Artichokes. Cabbage. Broccoli. Cauliflower. Green peas. Carrots. Squash. Grains Whole-grain breads. Multigrain cereal. Oats and oatmeal. Brown rice. Barley. Bulgur wheat. Millet. Quinoa. Bran muffins. Popcorn. Rye wafer crackers. Meats and other proteins Navy beans, kidney beans, and pinto beans. Soybeans. Split peas. Lentils. Nuts and  seeds. Dairy Fiber-fortified yogurt. Beverages Fiber-fortified soy milk. Fiber-fortified orange juice. Other foods Fiber bars. The items listed above may not be a complete list of recommended foods and beverages. Contact a dietitian for more information. What foods should I avoid? Fruits Fruit juice. Cooked, strained fruit. Vegetables Fried potatoes. Canned vegetables. Well-cooked vegetables. Grains White bread. Pasta made with refined flour. White rice. Meats and other proteins Fatty cuts of meat. Fried chicken or fried fish. Dairy Milk. Yogurt. Cream cheese. Sour cream. Fats and oils Butters. Beverages Soft drinks. Other foods Cakes and pastries. The items listed above may not be a complete list of foods and beverages to avoid. Talk with your dietitian about what choices are best for you. Summary  Fiber is a type of carbohydrate. It is found in foods such as fruits, vegetables, whole grains, and beans.  A high-fiber diet has many benefits. It can help to prevent constipation, lower blood cholesterol, aid weight loss, and reduce your risk of heart disease, diabetes, and certain cancers.  Increase your intake of fiber gradually. Increasing fiber too quickly may cause cramping, bloating, and gas. Drink plenty of water while you increase the amount of fiber you consume.  The best sources of fiber include whole fruits and vegetables, whole grains, nuts, seeds, and beans. This information is not intended to replace advice given to you by your health care provider. Make sure you discuss any questions you have with your health care provider. Document Revised: 08/19/2019 Document Reviewed: 08/19/2019 Elsevier Patient Education  2021 Elsevier Inc.  High Cholesterol  High cholesterol is a condition in which the blood has high levels of a white, waxy substance similar to fat (cholesterol). The liver makes all the cholesterol that the body needs. The human body needs small amounts of  cholesterol to help build cells. A person gets extra or excess cholesterol from the food that he or she eats. The blood carries cholesterol from the liver to the rest of the body. If you have high cholesterol, deposits (plaques) may build up on the walls of your arteries. Arteries are the blood vessels that carry blood away from your heart. These plaques make the arteries narrow and stiff. Cholesterol plaques increase your risk for heart attack and stroke. Work with your health care provider to keep your cholesterol levels in a healthy range. What increases the risk? The following factors may make you more likely to develop this condition:  Eating foods that are high in animal fat (saturated fat) or cholesterol.  Being overweight.  Not getting enough exercise.  A family history of high cholesterol (familial hypercholesterolemia).  Use of tobacco products.  Having diabetes. What are the signs or symptoms? There are no symptoms of this condition. How is this diagnosed? This condition may be diagnosed based on  the results of a blood test.  If you are older than 63 years of age, your health care provider may check your cholesterol levels every 4-6 years.  You may be checked more often if you have high cholesterol or other risk factors for heart disease. The blood test for cholesterol measures:  "Bad" cholesterol, or LDL cholesterol. This is the main type of cholesterol that causes heart disease. The desired level is less than 100 mg/dL.  "Good" cholesterol, or HDL cholesterol. HDL helps protect against heart disease by cleaning the arteries and carrying the LDL to the liver for processing. The desired level for HDL is 60 mg/dL or higher.  Triglycerides. These are fats that your body can store or burn for energy. The desired level is less than 150 mg/dL.  Total cholesterol. This measures the total amount of cholesterol in your blood and includes LDL, HDL, and triglycerides. The desired  level is less than 200 mg/dL. How is this treated? This condition may be treated with:  Diet changes. You may be asked to eat foods that have more fiber and less saturated fats or added sugar.  Lifestyle changes. These may include regular exercise, maintaining a healthy weight, and quitting use of tobacco products.  Medicines. These are given when diet and lifestyle changes have not worked. You may be prescribed a statin medicine to help lower your cholesterol levels. Follow these instructions at home: Eating and drinking  Eat a healthy, balanced diet. This diet includes: ? Daily servings of a variety of fresh, frozen, or canned fruits and vegetables. ? Daily servings of whole grain foods that are rich in fiber. ? Foods that are low in saturated fats and trans fats. These include poultry and fish without skin, lean cuts of meat, and low-fat dairy products. ? A variety of fish, especially oily fish that contain omega-3 fatty acids. Aim to eat fish at least 2 times a week.  Avoid foods and drinks that have added sugar.  Use healthy cooking methods, such as roasting, grilling, broiling, baking, poaching, steaming, and stir-frying. Do not fry your food except for stir-frying.   Lifestyle  Get regular exercise. Aim to exercise for a total of 150 minutes a week. Increase your activity level by doing activities such as gardening, walking, and taking the stairs.  Do not use any products that contain nicotine or tobacco, such as cigarettes, e-cigarettes, and chewing tobacco. If you need help quitting, ask your health care provider.   General instructions  Take over-the-counter and prescription medicines only as told by your health care provider.  Keep all follow-up visits as told by your health care provider. This is important. Where to find more information  American Heart Association: www.heart.org  National Heart, Lung, and Blood Institute: [employer].is Contact a health care  provider if:  You have trouble achieving or maintaining a healthy diet or weight.  You are starting an exercise program.  You are unable to stop smoking. Get help right away if:  You have chest pain.  You have trouble breathing.  You have any symptoms of a stroke. "BE FAST" is an easy way to remember the main warning signs of a stroke: ? B - Balance. Signs are dizziness, sudden trouble walking, or loss of balance. ? E - Eyes. Signs are trouble seeing or a sudden change in vision. ? F - Face. Signs are sudden weakness or numbness of the face, or the face or eyelid drooping on one side. ? A - Arms. Signs  are weakness or numbness in an arm. This happens suddenly and usually on one side of the body. ? S - Speech. Signs are sudden trouble speaking, slurred speech, or trouble understanding what people say. ? T - Time. Time to call emergency services. Write down what time symptoms started.  You have other signs of a stroke, such as: ? A sudden, severe headache with no known cause. ? Nausea or vomiting. ? Seizure. These symptoms may represent a serious problem that is an emergency. Do not wait to see if the symptoms will go away. Get medical help right away. Call your local emergency services (911 in the [place] Do not drive yourself to the hospital. Summary  Cholesterol plaques increase your risk for heart attack and stroke. Work with your health care provider to keep your cholesterol levels in a healthy range.  Eat a healthy, balanced diet, get regular exercise, and maintain a healthy weight.  Do not use any products that contain nicotine or tobacco, such as cigarettes, e-cigarettes, and chewing tobacco.  Get help right away if you have any symptoms of a stroke. This information is not intended to replace advice given to you by your health care provider. Make sure you discuss any questions you have with your health care provider. Document Revised: 03/15/2019 Document Reviewed:  03/15/2019 Elsevier Patient Education  2021 [employer].

## 2020-07-03 NOTE — Progress Notes (Signed)
Hard copy of labs provided to pt by request. Reviewed OTC Vit D dosing and diet r/t LDL. Denies further questions.

## 2020-07-04 NOTE — Progress Notes (Signed)
Noted patient came to get paper copy of labs. Reviewed RN Rolly Salter note.

## 2020-07-04 NOTE — Telephone Encounter (Signed)
Patient saw RN Rolly Salter and reviewed results/instructions.

## 2020-07-28 ENCOUNTER — Ambulatory Visit: Payer: Self-pay | Admitting: *Deleted

## 2020-07-28 ENCOUNTER — Other Ambulatory Visit: Payer: Self-pay

## 2020-07-28 DIAGNOSIS — Z23 Encounter for immunization: Secondary | ICD-10-CM

## 2020-07-28 NOTE — Progress Notes (Signed)
2nd dose Shingrix administered.  1st dose 04/20/20.

## 2020-09-05 ENCOUNTER — Other Ambulatory Visit: Payer: Self-pay | Admitting: *Deleted

## 2020-09-05 MED ORDER — MECLIZINE HCL 25 MG PO TABS: 25.0000 mg | ORAL_TABLET | Freq: Three times a day (TID) | ORAL | 1 refills | Status: DC | PRN

## 2020-09-05 NOTE — Telephone Encounter (Signed)
Pt requests Meclizine refill this morning. Sts sx flaring up today with dizziness and nausea. Last filled Aug 2020. Verbal order received from NP [facility]

## 2021-05-19 IMAGING — US US ABDOMEN LIMITED
1 series · 14 of 25 positions shown · non-contrast
Comparison: 01/16/2016

CLINICAL DATA: Fatty liver, gallbladder sludge, cholelithiasis

EXAM:
ULTRASOUND ABDOMEN LIMITED RIGHT UPPER QUADRANT

[Series 1: us abdomen limited · 0.21mm/px · 14 of 82 slices shown]
[im 1/82]
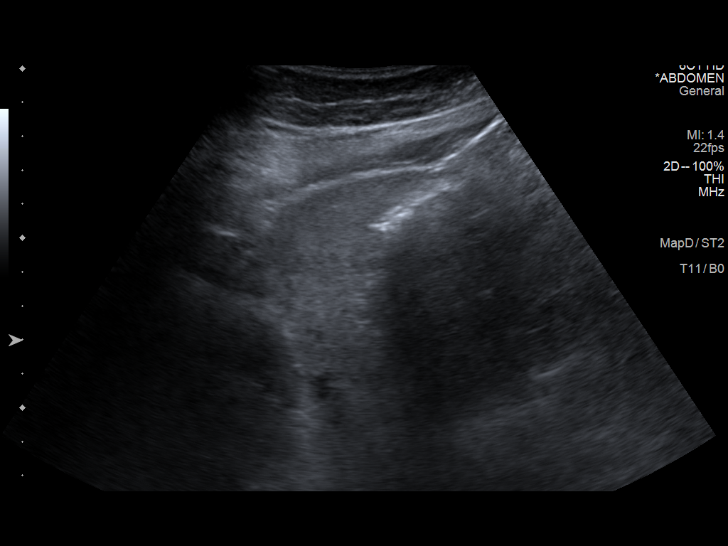
[im 7/82]
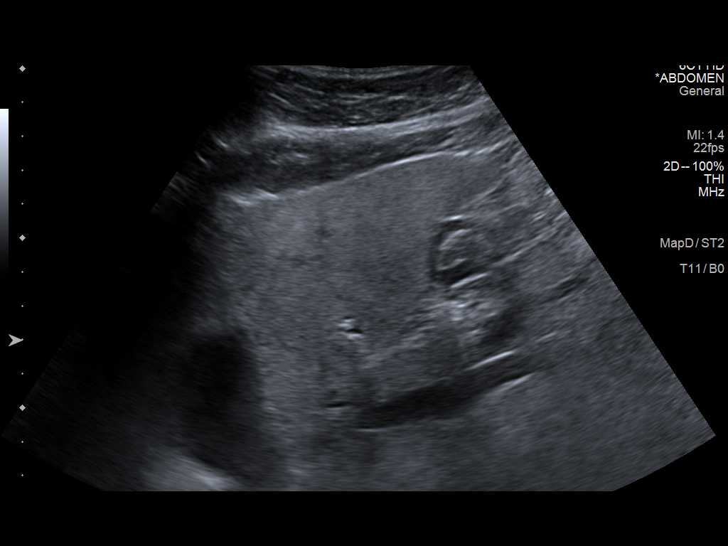
[im 14/82]
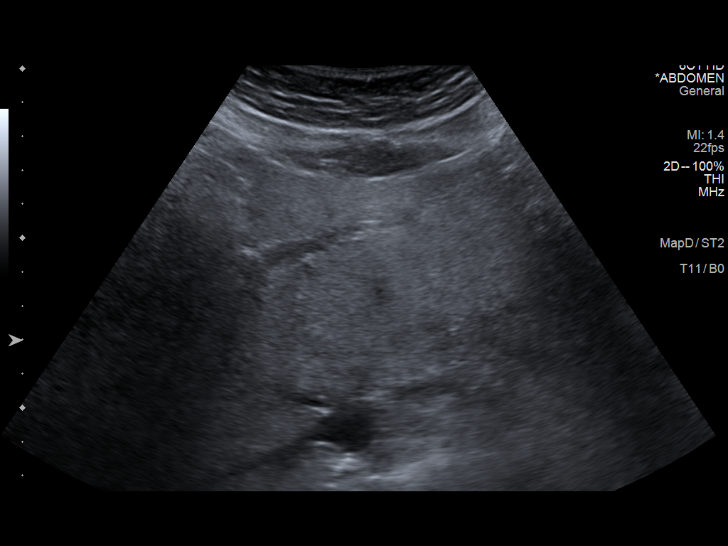
[im 21/82]
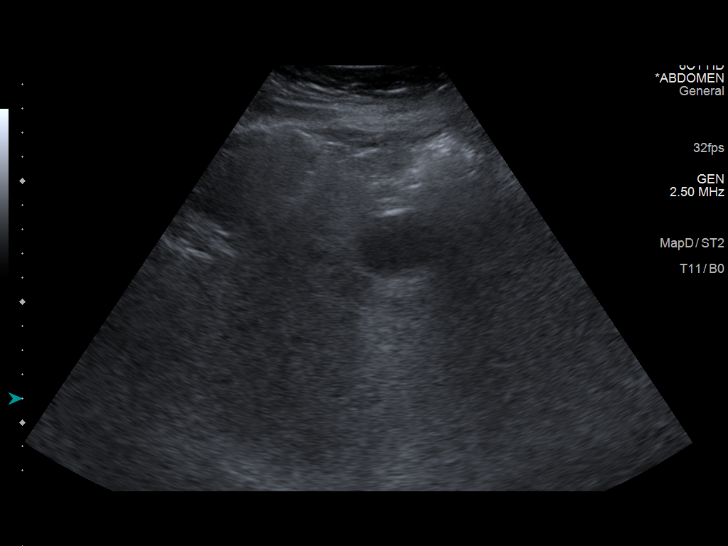
[im 28/82]
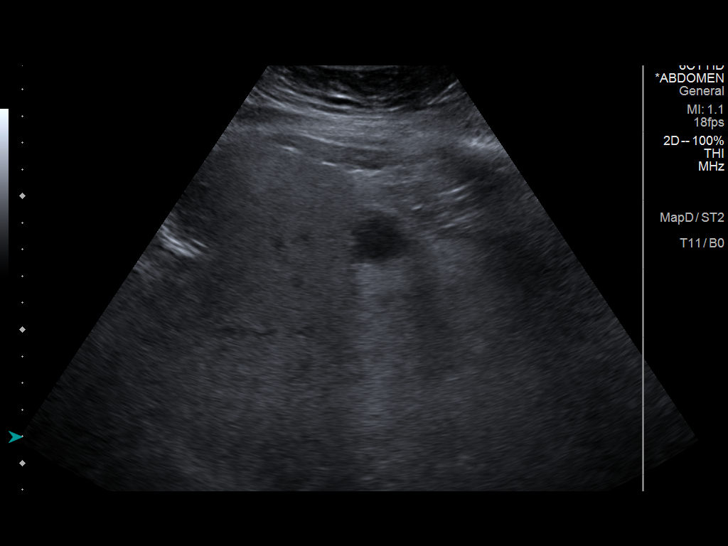
[im 31/82]
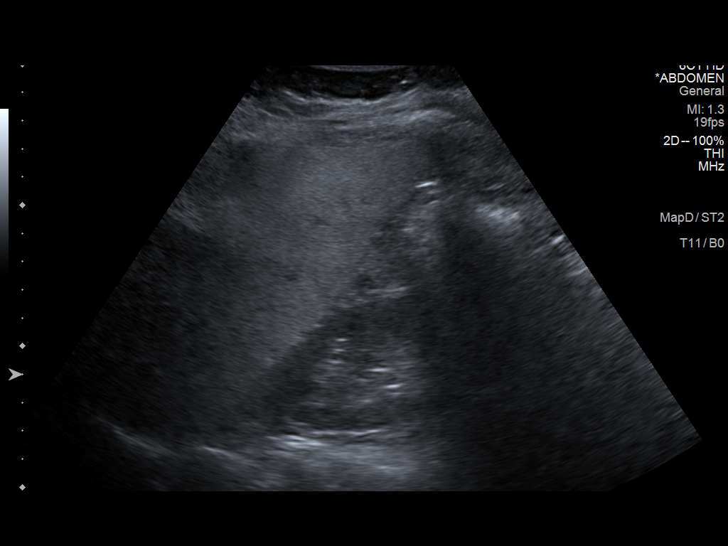
[im 38/82]
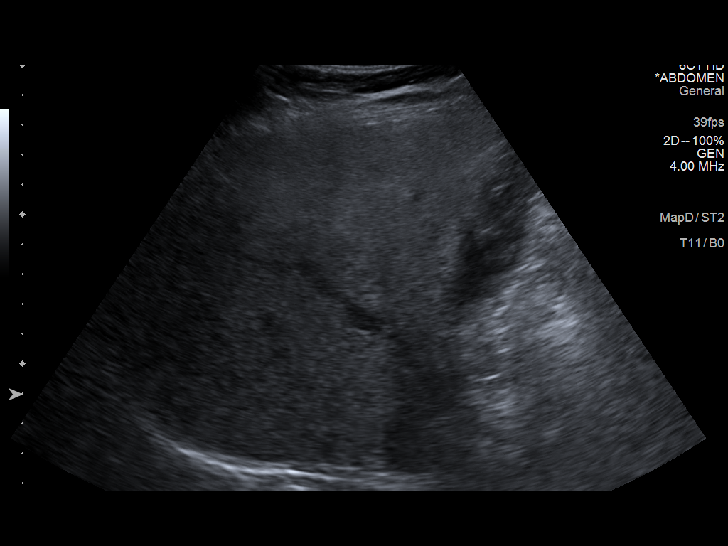
[im 44/82]
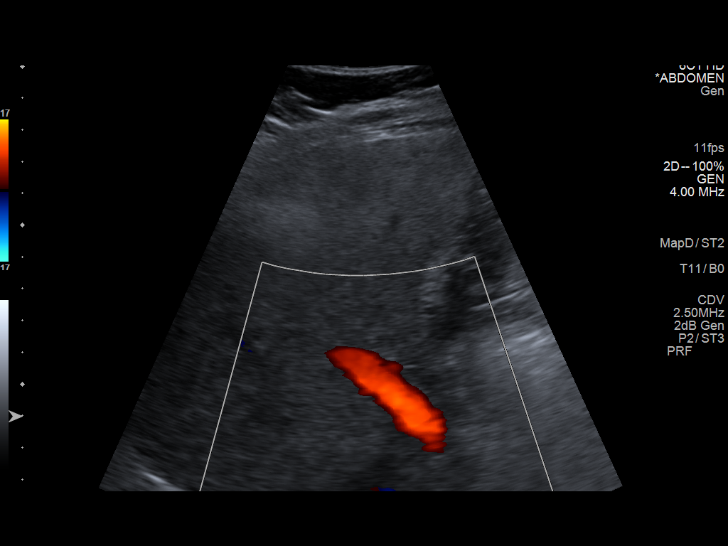
[im 51/82]
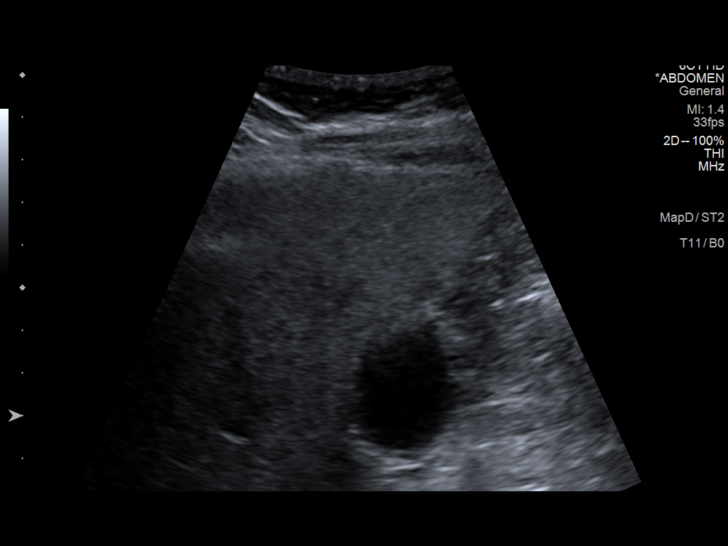
[im 55/82]
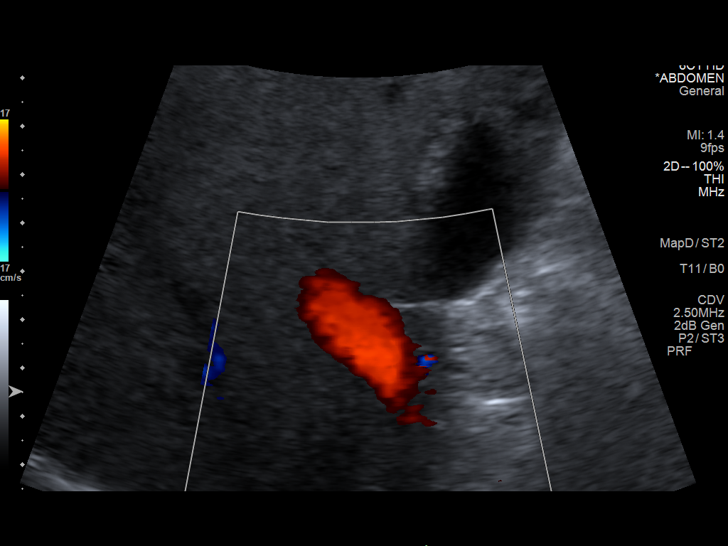
[im 61/82]
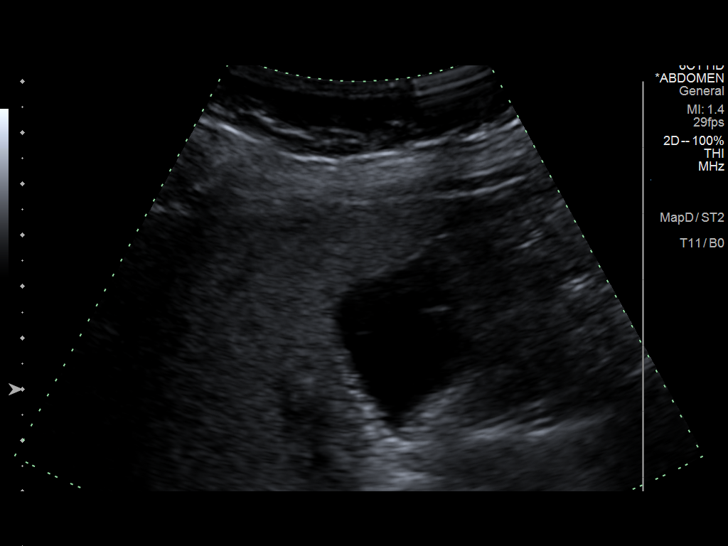
[im 68/82]
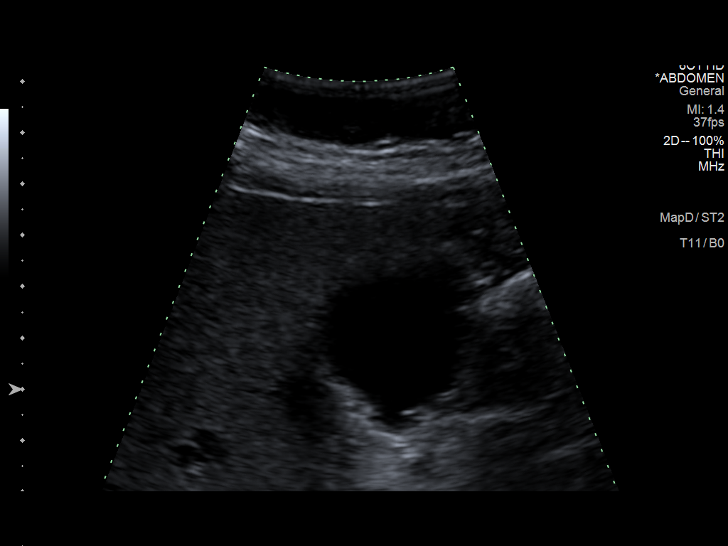
[im 75/82]
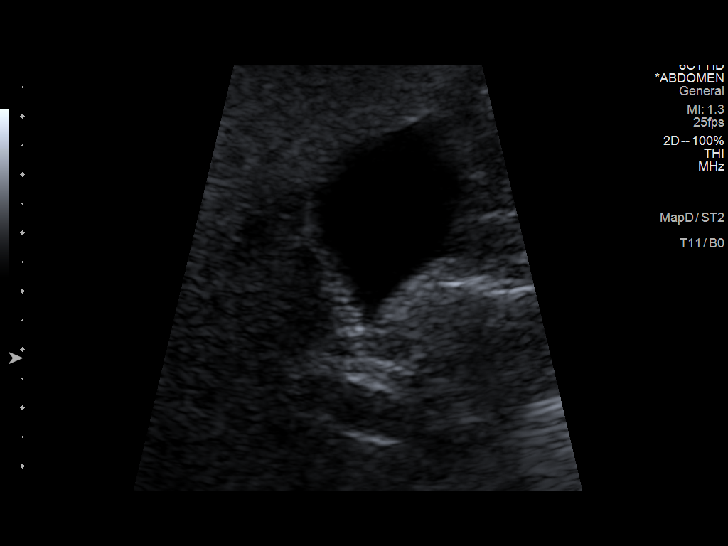
[im 82/82]
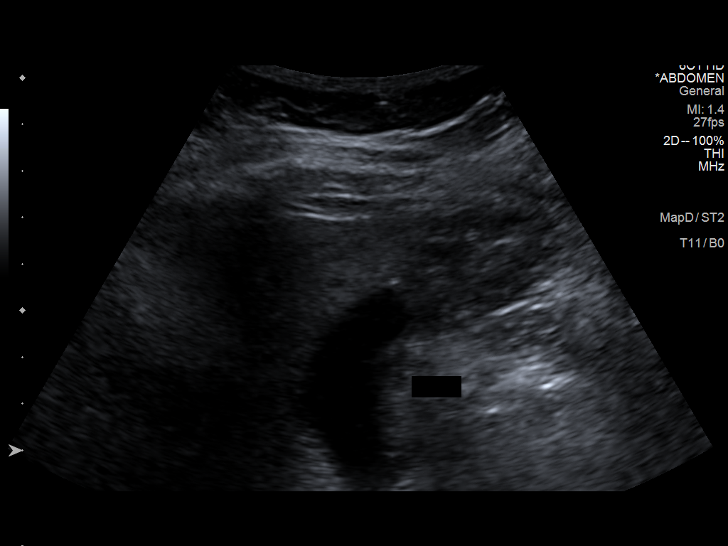

[14 of 25 positions shown; findings below may reference images not displayed]

FINDINGS: Gallbladder:

Nonshadowing echogenic material layering dependently within the
gallbladder likely reflects tumefactive sludge. No shadowing
gallstones. No evidence of acute cholecystitis.

Common bile duct:

Diameter: 3 mm

Liver:

There is diffuse increased liver echotexture unchanged since prior
study and compatible with fatty infiltration. No focal liver
abnormalities. Portal vein is patent on color Doppler imaging with
normal direction of blood flow towards the liver.

Other: None.
IMPRESSION: 1. Gallbladder sludge.  No evidence of cholecystitis.
2. Stable fatty infiltration of the liver.

## 2021-07-20 ENCOUNTER — Ambulatory Visit: Payer: Self-pay | Admitting: *Deleted

## 2021-07-20 VITALS — BP 128/78 | HR 83 | Ht 62.5 in | Wt 170.0 lb

## 2021-07-20 DIAGNOSIS — Z0000 Encounter for general adult medical examination without abnormal findings: Secondary | ICD-10-CM

## 2021-07-20 DIAGNOSIS — E559 Vitamin D deficiency, unspecified: Secondary | ICD-10-CM

## 2021-07-20 NOTE — Progress Notes (Signed)
[employer] insurance premium discount evaluation: Labs Drawn. Replacements ROI form signed. Tobacco Free Attestation form signed.  Forms placed in paper chart.  

## 2021-07-21 ENCOUNTER — Encounter: Payer: Self-pay | Admitting: Registered Nurse

## 2021-07-21 DIAGNOSIS — E559 Vitamin D deficiency, unspecified: Secondary | ICD-10-CM

## 2021-07-21 LAB — CMP12+LP+TP+TSH+6AC+CBC/D/PLT
ALT: 28 IU/L (ref 0–32)
AST: 32 IU/L (ref 0–40)
Albumin/Globulin Ratio: 1.5 (ref 1.2–2.2)
Albumin: 4.5 g/dL (ref 3.8–4.8)
Alkaline Phosphatase: 116 IU/L (ref 44–121)
BUN/Creatinine Ratio: 17 (ref 12–28)
BUN: 12 mg/dL (ref 8–27)
Basophils Absolute: 0.1 10*3/uL (ref 0.0–0.2)
Basos: 1 %
Bilirubin Total: 0.6 mg/dL (ref 0.0–1.2)
Calcium: 9.2 mg/dL (ref 8.7–10.3)
Chloride: 101 mmol/L (ref 96–106)
Chol/HDL Ratio: 4 ratio (ref 0.0–4.4)
Cholesterol, Total: 189 mg/dL (ref 100–199)
Creatinine, Ser: 0.7 mg/dL (ref 0.57–1.00)
EOS (ABSOLUTE): 0.2 10*3/uL (ref 0.0–0.4)
Eos: 2 %
Estimated CHD Risk: 0.9 times avg. (ref 0.0–1.0)
Free Thyroxine Index: 2.1 (ref 1.2–4.9)
GGT: 25 IU/L (ref 0–60)
Globulin, Total: 3.1 g/dL (ref 1.5–4.5)
Glucose: 85 mg/dL (ref 70–99)
HDL: 47 mg/dL (ref >39)
Hematocrit: 43.7 % (ref 34.0–46.6)
Hemoglobin: 14.8 g/dL (ref 11.1–15.9)
Immature Grans (Abs): 0 10*3/uL (ref 0.0–0.1)
Immature Granulocytes: 1 %
Iron: 130 ug/dL (ref 27–139)
LDH: 209 IU/L (ref 119–226)
LDL Chol Calc (NIH): 124 mg/dL — ABNORMAL HIGH (ref 0–99)
Lymphocytes Absolute: 3.3 10*3/uL — ABNORMAL HIGH (ref 0.7–3.1)
Lymphs: 41 %
MCH: 31 pg (ref 26.6–33.0)
MCHC: 33.9 g/dL (ref 31.5–35.7)
MCV: 92 fL (ref 79–97)
Monocytes Absolute: 0.6 10*3/uL (ref 0.1–0.9)
Monocytes: 7 %
Neutrophils Absolute: 3.8 10*3/uL (ref 1.4–7.0)
Neutrophils: 48 %
Phosphorus: 3.3 mg/dL (ref 3.0–4.3)
Platelets: 337 10*3/uL (ref 150–450)
Potassium: 4.2 mmol/L (ref 3.5–5.2)
RBC: 4.77 x10E6/uL (ref 3.77–5.28)
RDW: 12.3 % (ref 11.7–15.4)
Sodium: 139 mmol/L (ref 134–144)
T3 Uptake Ratio: 23 % — ABNORMAL LOW (ref 24–39)
T4, Total: 9.3 ug/dL (ref 4.5–12.0)
TSH: 1.36 u[IU]/mL (ref 0.450–4.500)
Total Protein: 7.6 g/dL (ref 6.0–8.5)
Triglycerides: 100 mg/dL (ref 0–149)
Uric Acid: 5.4 mg/dL (ref 3.0–7.2)
VLDL Cholesterol Cal: 18 mg/dL (ref 5–40)
WBC: 8 10*3/uL (ref 3.4–10.8)
eGFR: 97 mL/min/{1.73_m2} (ref >59)

## 2021-07-21 LAB — VITAMIN D 25 HYDROXY (VIT D DEFICIENCY, FRACTURES): Vit D, 25-Hydroxy: 22.5 ng/mL — ABNORMAL LOW (ref 30.0–100.0)

## 2021-07-21 LAB — HGB A1C W/O EAG: Hgb A1c MFr Bld: 5.6 % (ref 4.8–5.6)

## 2021-07-21 NOTE — Addendum Note (Signed)
Addended by: Albina Billet A on: 07/21/2021 01:00 PM ? ? Modules accepted: Orders ? ?

## 2021-07-23 MED ORDER — CHOLECALCIFEROL 1.25 MG (50000 UT) PO TABS: 1.0000 | ORAL_TABLET | ORAL | 0 refills | Status: DC

## 2021-07-23 NOTE — Progress Notes (Signed)
Pt in to clinic requesting result review. Diet and exercise recommendations discussed re: LDL and borderline A1c. Pt confirms she is not taking any vitamin D supplement. She is agreeable to starting high dose weekly Vit D and confirms [facility] of [facility] preferred pharmacy. Typically is in the sun more in the summer, on daily basis usually. Appt made for 3 month vit D recheck with BP and weight rechecks. Routine f/u with pcp. Copy of labs provided to pt. Results routed to pcp per pt request. No further questions/concerns.  ?

## 2021-07-23 NOTE — Progress Notes (Signed)
Noted Rx sent to patient's preferred pharmacy for vitamin D 50,000 units weekly by mouth x 12 weeks and scheduled for repeat vitamin D level in 3 months with RN Rolly Salter. ?

## 2021-11-16 ENCOUNTER — Telehealth: Payer: Self-pay | Admitting: Registered Nurse

## 2021-11-16 DIAGNOSIS — E559 Vitamin D deficiency, unspecified: Secondary | ICD-10-CM

## 2021-11-20 NOTE — Telephone Encounter (Signed)
Patient stated she would schedule with RN Burna Mortimer Thursday or Friday nonfasting vitamin D level draw.  No further questions or concerns at this time.

## 2021-12-22 NOTE — Telephone Encounter (Signed)
Patient had labs drawn at GYN office  Vitamin D still low    Did not see new/active Rx for cholecalciferol for patient.  Telephone message left for patient to verify if new Rx sent in by GYN or if taking OTC.  Patient to come to [facility], call [phone] or email [email or web address]  TSH  1.660 uIU/mL 0.450 - 4.500 T4,Free(Direct)  1.12 ng/dL 0.82 - 1.77 CBC With Differential/Platelet (#[id number]) Test Results Flag Units Reference Interval WBC  8.5 x10E3/uL 3.4 - 10.8 RBC  4.64 x10E6/uL 3.77 - 5.28 Hemoglobin  14.5 g/dL 11.1 - 15.9 Hematocrit  42.7 % 34.0 - 46.6 MCV  92 fL 79 - 97 MCH  31.3 pg 26.6 - 33.0 MCHC  34.0 g/dL 31.5 - 35.7 RDW  12.6 % 11.7 - 15.4 Platelets  332 x10E3/uL 150 - 450 Neutrophils  50  %  Not Estab. Lymphs  37  %  Not Estab. Monocytes  9  %  Not Estab. Eos  2  %  Not Estab. Basos  1  %  Not Estab. Neutrophils (Absolute)  4.3 x10E3/uL 1.4 - 7.0 Lymphs (Absolute)  3.2 High x10E3/uL 0.7 - 3.1 Monocytes(Absolute)  0.7 x10E3/uL 0.1 - 0.9 Eos (Absolute)  0.2 x10E3/uL 0.0 - 0.4 Baso (Absolute)  0.1 x10E3/uL 0.0 - 0.2 Immature Granulocytes  1  %  Not Estab. Immature Grans (Abs)  0.0 x10E3/uL 0.0 - 0.1 Comp. Metabolic Panel (14) (#[id number]) Test Results Flag Units Reference Interval Glucose  100 High mg/dL 70 - 99 BUN  12 mg/dL 8 - 27 Creatinine  0.68 mg/dL 0.57 - 1.00 eGFR  97  mL/min/1.73  BUN/Creatinine Ratio  18 12 - 28 Sodium  142 mmol/L 134 - 144 Potassium  4.4 mmol/L 3.5 - 5.2 Chloride  104 mmol/L 96 - 106 Carbon Dioxide, Total  20 mmol/L 20 - 29 Calcium  9.2 mg/dL 8.7 - 10.3 Protein, Total  7.4 g/dL 6.0 - 8.5 Albumin  4.4 g/dL 3.9 - 4.9 Globulin, Total  3.0 g/dL 1.5 - 4.5 A/G Ratio  1.5 1.2 - 2.2 Bilirubin, Total  0.4 mg/dL 0.0 - 1.2 Alkaline Phosphatase  123 High IU/L 44 - 121 AST (SGOT)  20 IU/L 0 - 40 ALT (SGPT)  24 IU/L 0 - 32 Lipid Panel With LDL/HDL  Ratio (#[id number]) Test Results Flag Units Reference Interval Cholesterol, Total  186 mg/dL 100 - 199 Triglycerides  88 mg/dL 0 - 149 HDL Cholesterol  52  mg/dL  VLDL Cholesterol Cal  16 mg/dL 5 - 40 LDL Chol Calc (NIH)  118 High mg/dL 0 - 99 LDL/HDL Ratio  2.3 ratio 0.0 - 3.2 Please Note:                                                     LDL/HDL Ratio                                                            Men  Women  1/2 Avg.Risk  1.0    1.5                                                  Avg.Risk  3.6    3.2                                               2X Avg.Risk  6.2    5.0                                               3X Avg.Risk  8.0    6.1 Hemoglobin A1c (#[id number]) Test Results Flag Units Reference Interval Hemoglobin A1c  5.6 % 4.8 - 5.6 Please Note:                                                         .          Prediabetes: 5.7 - 6.4          Diabetes: >6.4          Glycemic control for adults with diabetes: <7.0 Vitamin D, 25-Hydroxy (#[id number]) Test Results Flag Units Reference Interval Vitamin D, 25-Hydroxy  26.9 Vitamin D deficiency has been defined by the [place] practice guideline as a level of serum 25-OH vitamin D less than 20 ng/mL (1,2). The Endocrine Society went on to further define vitamin D insufficiency as a level between 21 and 29 ng/mL (2). 1. IOM (Institute of Medicine). 2010. Dietary reference    intakes for calcium and D. [place]: The    [employer]. 2. Holick MF, Binkley Lockhart, Bischoff-Ferrari HA, et al.    Evaluation, treatment, and prevention of vitamin D    deficiency: an Endocrine Society clinical practice    guideline. JCEM. 2011 Jul; 96(7):1911-30.

## 2021-12-24 ENCOUNTER — Encounter: Payer: Self-pay | Admitting: Registered Nurse

## 2021-12-24 ENCOUNTER — Telehealth: Payer: Self-pay | Admitting: Registered Nurse

## 2021-12-24 DIAGNOSIS — E559 Vitamin D deficiency, unspecified: Secondary | ICD-10-CM

## 2021-12-24 MED ORDER — CHOLECALCIFEROL 1.25 MG (50000 UT) PO TABS: 1.0000 | ORAL_TABLET | ORAL | 0 refills | Status: DC

## 2021-12-24 NOTE — Telephone Encounter (Signed)
Patient had labs done at GYN office D level low 26.9 patient requested refill cholecalciferol 50,000 units po weekly #12 RF0 be sent to her pharmacy of choice done electronically.  Patient notified next nonfasting lab in 3 months to check vitamin D level.  Patient verbalized understanding information/instructions, agreed with plan of care and had no further questions at this time.

## 2021-12-26 NOTE — Telephone Encounter (Signed)
Late entry patient contacted NP stated last dose 50,000 units weekly 1 tab and she had run out would like Rx refilled.  Rx sent to her pharmacy of choice on 12/24/21 and patient to have repeat vitamin D level in 3 months.  Patient verbalized understanding information/instructions and had no further questions at this time/agreed with plan of care.

## 2022-06-24 ENCOUNTER — Encounter: Payer: Self-pay | Admitting: Registered Nurse

## 2022-06-24 ENCOUNTER — Telehealth: Payer: Self-pay | Admitting: Registered Nurse

## 2022-06-24 DIAGNOSIS — Z8639 Personal history of other endocrine, nutritional and metabolic disease: Secondary | ICD-10-CM

## 2022-06-24 DIAGNOSIS — Z0000 Encounter for general adult medical examination without abnormal findings: Secondary | ICD-10-CM

## 2022-06-24 NOTE — Telephone Encounter (Signed)
Appt 06/27/22 for fasting labs.  Executive panel, Hgba1c and vitamin D ordered for patient.  RN [employer] notified.

## 2022-06-27 ENCOUNTER — Other Ambulatory Visit: Payer: Self-pay | Admitting: Occupational Medicine

## 2022-06-27 DIAGNOSIS — Z0000 Encounter for general adult medical examination without abnormal findings: Secondary | ICD-10-CM

## 2022-06-27 NOTE — Progress Notes (Signed)
Lab drawn  tolerated well no issues noted.

## 2022-06-29 ENCOUNTER — Encounter: Payer: Self-pay | Admitting: Registered Nurse

## 2022-06-29 DIAGNOSIS — Z8639 Personal history of other endocrine, nutritional and metabolic disease: Secondary | ICD-10-CM | POA: Insufficient documentation

## 2022-06-29 DIAGNOSIS — R5383 Other fatigue: Secondary | ICD-10-CM

## 2022-06-29 DIAGNOSIS — M7918 Myalgia, other site: Secondary | ICD-10-CM

## 2022-06-29 LAB — CMP12+LP+TP+TSH+6AC+CBC/D/PLT
ALT: 25 IU/L (ref 0–32)
AST: 29 IU/L (ref 0–40)
Albumin/Globulin Ratio: 1.6 (ref 1.2–2.2)
Albumin: 4.7 g/dL (ref 3.9–4.9)
Alkaline Phosphatase: 124 IU/L — ABNORMAL HIGH (ref 44–121)
BUN/Creatinine Ratio: 13 (ref 12–28)
BUN: 12 mg/dL (ref 8–27)
Basophils Absolute: 0.1 10*3/uL (ref 0.0–0.2)
Basos: 1 %
Bilirubin Total: 0.4 mg/dL (ref 0.0–1.2)
Calcium: 9.4 mg/dL (ref 8.7–10.3)
Chloride: 108 mmol/L — ABNORMAL HIGH (ref 96–106)
Chol/HDL Ratio: 3.9 ratio (ref 0.0–4.4)
Cholesterol, Total: 176 mg/dL (ref 100–199)
Creatinine, Ser: 0.92 mg/dL (ref 0.57–1.00)
EOS (ABSOLUTE): 0.2 10*3/uL (ref 0.0–0.4)
Eos: 3 %
Estimated CHD Risk: 0.8 times avg. (ref 0.0–1.0)
Free Thyroxine Index: 2.5 (ref 1.2–4.9)
GGT: 26 IU/L (ref 0–60)
Globulin, Total: 3 g/dL (ref 1.5–4.5)
Glucose: 106 mg/dL — ABNORMAL HIGH (ref 70–99)
HDL: 45 mg/dL (ref >39)
Hematocrit: 43.3 % (ref 34.0–46.6)
Hemoglobin: 14.6 g/dL (ref 11.1–15.9)
Immature Grans (Abs): 0 10*3/uL (ref 0.0–0.1)
Immature Granulocytes: 0 %
Iron: 112 ug/dL (ref 27–139)
LDH: 209 IU/L (ref 119–226)
LDL Chol Calc (NIH): 113 mg/dL — ABNORMAL HIGH (ref 0–99)
Lymphocytes Absolute: 3.6 10*3/uL — ABNORMAL HIGH (ref 0.7–3.1)
Lymphs: 40 %
MCH: 31.1 pg (ref 26.6–33.0)
MCHC: 33.7 g/dL (ref 31.5–35.7)
MCV: 92 fL (ref 79–97)
Monocytes Absolute: 0.7 10*3/uL (ref 0.1–0.9)
Monocytes: 8 %
Neutrophils Absolute: 4.3 10*3/uL (ref 1.4–7.0)
Neutrophils: 48 %
Phosphorus: 3 mg/dL (ref 3.0–4.3)
Platelets: 329 10*3/uL (ref 150–450)
Potassium: 4.2 mmol/L (ref 3.5–5.2)
RBC: 4.69 x10E6/uL (ref 3.77–5.28)
RDW: 12.2 % (ref 11.7–15.4)
Sodium: 148 mmol/L — ABNORMAL HIGH (ref 134–144)
T3 Uptake Ratio: 27 % (ref 24–39)
T4, Total: 9.2 ug/dL (ref 4.5–12.0)
TSH: 1.37 u[IU]/mL (ref 0.450–4.500)
Total Protein: 7.7 g/dL (ref 6.0–8.5)
Triglycerides: 99 mg/dL (ref 0–149)
Uric Acid: 5.5 mg/dL (ref 3.0–7.2)
VLDL Cholesterol Cal: 18 mg/dL (ref 5–40)
WBC: 8.9 10*3/uL (ref 3.4–10.8)
eGFR: 69 mL/min/{1.73_m2} (ref >59)

## 2022-06-29 LAB — HEMOGLOBIN A1C
Est. average glucose Bld gHb Est-mCnc: 111 mg/dL
Hgb A1c MFr Bld: 5.5 % (ref 4.8–5.6)

## 2022-06-29 LAB — VITAMIN D 25 HYDROXY (VIT D DEFICIENCY, FRACTURES): Vit D, 25-Hydroxy: 57.8 ng/mL (ref 30.0–100.0)

## 2022-06-29 MED ORDER — CHOLECALCIFEROL 1.25 MG (50000 UT) PO TABS: 1.0000 | ORAL_TABLET | ORAL | 2 refills | Status: DC

## 2022-06-29 MED ORDER — VITAMIN D3 25 MCG (1000 UT) PO CAPS: 1000.0000 [IU] | ORAL_CAPSULE | Freq: Every day | ORAL | 2 refills | Status: DC

## 2022-06-29 NOTE — Progress Notes (Signed)
My chart message sent to patient.    Teresa Mccormick,  Your spot blood sugar, sodium, chloride, alkaline phosphatase, lymphocytes and LDL cholesterol were elevated.  I recommend repeating labs in 1 year fasting.  If you start having palpitations, dizziness, or muscle cramps please let us know.  Have you been sick recently? .  Please see RN Evlyn Kanner if you would like labs sent to [facility] electronically or have printed copy.  Follow up with PCM elevated cholesterol, lymphocytes, sodium, chloride, alkaline phosphatase.  Exitcare handouts sent to your my chart on complete blood count, [employer] dietitian, liver function tests and prediabetes/prediabetes diet.  I recommend 15 minutes sunlight on skin.  Take vitamin D supplement with fattiest meal for best absorption; You may decrease dose to 1000 units daily over the counter for the summer.  I sent Rx to your pharmacy to restart high dose in the fall cholecalciferol 50,000 units weekly by mouth #12 RF2 and repeat vitamin D level in 1 year. I recommend repeat other labs in 1 year.   [employer] dietitian is free this is link to schedule appt [facility] ([email or web address]) I recommend weight loss, exercise 150 minutes per week; dietary fiber 30 grams per day men and 20 grams women per up to date; eat whole grains/fruits/vegetables; keep added sugars to less than 100 calories/ 5 teaspoons for women per American Heart Association; 3 month blood sugar average, vitamin D, iron, kidney/liver function normal. No anemia noted on complete blood count.  Please let us know if you have further questions or concerns.  Sincerely, Gerarda Fraction NP-C

## 2022-06-29 NOTE — Addendum Note (Signed)
Addended by: Gerarda Fraction A on: 06/29/2022 08:05 AM   Modules accepted: Orders

## 2022-07-04 ENCOUNTER — Ambulatory Visit: Payer: Self-pay | Admitting: Occupational Medicine

## 2022-07-04 VITALS — BP 130/80 | HR 75 | Ht 62.0 in | Wt 174.0 lb

## 2022-07-04 DIAGNOSIS — Z0000 Encounter for general adult medical examination without abnormal findings: Secondary | ICD-10-CM

## 2022-07-04 DIAGNOSIS — Z8639 Personal history of other endocrine, nutritional and metabolic disease: Secondary | ICD-10-CM

## 2022-07-04 NOTE — Progress Notes (Signed)
Patient reports muscle aches but better after she recently started Vit B 12 it helps. Not been sick. Patient reports still feels low energy fatigue. Anything else she can do? NP made aware. Scheduled Follow up labs.    Your spot blood sugar, sodium, chloride, alkaline phosphatase, lymphocytes and LDL cholesterol were elevated.  I recommend repeating labs in 1 year fasting.  If you start having palpitations, dizziness, or muscle cramps please let us know.  Have you been sick recently?   Follow up with PCM elevated cholesterol, lymphocytes, sodium, chloride, alkaline phosphatase.  Exitcare handouts sent to your my chart on complete blood count, [employer] dietitian, liver function tests and prediabetes/prediabetes diet.  NP recommend 15 minutes sunlight on skin.  Take vitamin D supplement with fattiest meal for best absorption; You may decrease dose to 1000 units daily over the counter for the summer.  NP sent Rx to your pharmacy to restart high dose in the fall cholecalciferol 50,000 units weekly by mouth #12 RF2 and repeat vitamin D level in 1 year. I recommend repeat other labs in 1 year.   [employer] dietitian is free this is link to schedule appt [facility] ([email or web address]) I emailed patient. NP recommend weight loss, exercise 150 minutes per week; dietary fiber 30 grams per day men and 20 grams women per up to date; eat whole grains/fruits/vegetables; keep added sugars to less than 100 calories/ 5 teaspoons for women per American Heart Association; 3 month blood sugar average, vitamin D, iron, kidney/liver function normal. No anemia noted on complete blood count.  Please let us know if you have further questions or concerns.   [employer] well insurance premium discount evaluation:   Epic reviewed by RN Evlyn Kanner and transcribed labs.  Tobacco attestation signed. [facility] ROI formed signed. Forms placed in the chart.   Patient given handouts for [facility] pharmacies and discount drugs list,MyChart, Tele doc setup, Tele  doc Behavioral, [place] counseling and [facility] counseling.  What to do for infectious illness protocol. Given handout for list of medications that can be filled at [facility]. Given Clinic hours and Clinic Email.

## 2022-07-10 NOTE — Addendum Note (Signed)
Addended by: Gerarda Fraction A on: 07/10/2022 04:27 PM   Modules accepted: Orders

## 2022-07-10 NOTE — Telephone Encounter (Signed)
Patient notified me taking fish, magnesium and vitamin B supplement.  Will check magnesium level with next draw also.  Patient notified may continue supplements at this time.

## 2022-07-10 NOTE — Telephone Encounter (Signed)
Patient stated recently started B12 supplement but denied recent illness has been fatigued.  Will check B6/B12 with next lab draw for CBC recheck in 3 months also.  My chart message sent to patient.  RN [employer] notified.

## 2022-09-10 ENCOUNTER — Encounter: Payer: Self-pay | Admitting: Registered Nurse

## 2022-09-10 ENCOUNTER — Telehealth: Payer: Self-pay | Admitting: Registered Nurse

## 2022-09-10 MED ORDER — MECLIZINE HCL 25 MG PO TABS: 25.0000 mg | ORAL_TABLET | Freq: Three times a day (TID) | ORAL | 1 refills | Status: AC | PRN

## 2022-09-10 NOTE — Telephone Encounter (Signed)
Patient requested refill of meclizine has used prn vertigo uses less than 30 pills per year typically.  Epic reviewed last Rx 09/05/2020 #20 RF1  Typically gets new Rx every 2 years history of seasonal allergic rhinitis/eustachian tube dysfunction and BPPV.  Electronic Rx sent to patient pharmacy of choice.  RN Kimrey notified patient Rx sent and left patient message at work.

## 2022-10-09 ENCOUNTER — Other Ambulatory Visit: Payer: Self-pay | Admitting: Occupational Medicine

## 2022-10-13 NOTE — Progress Notes (Signed)
Noted patient has had follow up labs 

## 2022-10-16 ENCOUNTER — Other Ambulatory Visit: Payer: Self-pay | Admitting: Occupational Medicine

## 2022-10-16 DIAGNOSIS — R5383 Other fatigue: Secondary | ICD-10-CM

## 2022-10-16 DIAGNOSIS — M7918 Myalgia, other site: Secondary | ICD-10-CM

## 2022-10-16 DIAGNOSIS — Z0000 Encounter for general adult medical examination without abnormal findings: Secondary | ICD-10-CM

## 2022-10-16 NOTE — Progress Notes (Signed)
Lab drawn from [facility] tolerated well no issues noted.   

## 2022-10-17 LAB — CBC WITH DIFFERENTIAL/PLATELET
Basophils Absolute: 0.1 10*3/uL (ref 0.0–0.2)
Basos: 1 %
EOS (ABSOLUTE): 0.2 10*3/uL (ref 0.0–0.4)
Eos: 2 %
Hematocrit: 41.1 % (ref 34.0–46.6)
Hemoglobin: 13.9 g/dL (ref 11.1–15.9)
Immature Grans (Abs): 0 10*3/uL (ref 0.0–0.1)
Immature Granulocytes: 0 %
Lymphocytes Absolute: 2.7 10*3/uL (ref 0.7–3.1)
Lymphs: 36 %
MCH: 31.3 pg (ref 26.6–33.0)
MCHC: 33.8 g/dL (ref 31.5–35.7)
MCV: 93 fL (ref 79–97)
Monocytes Absolute: 0.6 10*3/uL (ref 0.1–0.9)
Monocytes: 7 %
Neutrophils Absolute: 4.1 10*3/uL (ref 1.4–7.0)
Neutrophils: 54 %
Platelets: 319 10*3/uL (ref 150–450)
RBC: 4.44 x10E6/uL (ref 3.77–5.28)
RDW: 12.7 % (ref 11.7–15.4)
WBC: 7.6 10*3/uL (ref 3.4–10.8)

## 2022-10-17 LAB — MAGNESIUM: Magnesium: 2.1 mg/dL (ref 1.6–2.3)

## 2022-10-17 LAB — VITAMIN B12: Vitamin B-12: 407 pg/mL (ref 232–1245)

## 2022-10-19 LAB — SPECIMEN STATUS

## 2022-10-21 ENCOUNTER — Other Ambulatory Visit: Payer: Self-pay | Admitting: Registered Nurse

## 2022-10-21 ENCOUNTER — Encounter: Payer: Self-pay | Admitting: Registered Nurse

## 2022-10-21 DIAGNOSIS — E531 Pyridoxine deficiency: Secondary | ICD-10-CM | POA: Insufficient documentation

## 2022-10-21 LAB — VITAMIN B6: Vitamin B6: 3.3 ug/L — ABNORMAL LOW (ref 3.4–65.2)

## 2022-10-21 LAB — SPECIMEN STATUS REPORT

## 2022-10-21 MED ORDER — VITAMIN B-6 50 MG PO TABS: 50.0000 mg | ORAL_TABLET | Freq: Every day | ORAL | 0 refills | Status: AC

## 2022-10-21 NOTE — Progress Notes (Signed)
My chart message sent to patient Teresa Mccormick, Your vitamin B6 was low.  I recommend starting supplement and recheck level in 3 to 6 months.  Exitcare handout on vitamin B6 sent to your my chart.  See RN Bess Kinds if you want printed copy of handout/results.  Please let me know if you have further questions Sincerely, Albina Billet NP-C

## 2022-11-24 NOTE — Progress Notes (Signed)
Noted still having fatigue patient to start vitamin B6 supplement also.

## 2022-11-24 NOTE — Progress Notes (Signed)
Epic reviewed read receipt lab results 06/29/22 saw RN Bess Kinds 07/04/22 discussed lab results/recommendations.  Follow up lab appt scheduled 01/08/23 BP 130/80 weight 174lbs Hgba1c 5.5 LDL 113 met requirements for FY 2025 insurance discount RN KImrey notified HR team and NP signed provider paperwork 07/11/22  my chart message sent to patient Teresa Mccormick, Due to new national guidelines [facility] will no longer be drawing vitamin D levels for [facility].  Cotninue your vitamin D supplement as previously discussed and follow up with PCM as needed.  Your next lab appt 01/08/23 for B6 recheck nonfasting.  Have you started taking a B6 supplement?  Please let us know if you have further questions.  Albina Billet NP-C

## 2022-11-27 ENCOUNTER — Telehealth: Payer: Self-pay | Admitting: Occupational Medicine

## 2022-11-27 NOTE — Telephone Encounter (Signed)
Patients fatigue is not as bad since starting Vitamin B6.

## 2022-12-09 ENCOUNTER — Ambulatory Visit: Payer: No Typology Code available for payment source

## 2022-12-09 ENCOUNTER — Telehealth: Payer: Self-pay | Admitting: Registered Nurse

## 2022-12-09 ENCOUNTER — Other Ambulatory Visit: Payer: Self-pay | Admitting: Registered Nurse

## 2022-12-09 ENCOUNTER — Encounter: Payer: Self-pay | Admitting: Registered Nurse

## 2022-12-09 DIAGNOSIS — R300 Dysuria: Secondary | ICD-10-CM

## 2022-12-09 DIAGNOSIS — N342 Other urethritis: Secondary | ICD-10-CM

## 2022-12-09 LAB — POCT URINALYSIS DIPSTICK
Glucose, UA: NEGATIVE
Ketones, UA: NEGATIVE
Nitrite, UA: NEGATIVE
Protein, UA: POSITIVE — AB
Spec Grav, UA: >=1.03 — AB (ref 1.010–1.025)
Urobilinogen, UA: 0.2 E.U./dL
pH, UA: 5 (ref 5.0–8.0)

## 2022-12-09 MED ORDER — PHENAZOPYRIDINE HCL 200 MG PO TABS: 200.0000 mg | ORAL_TABLET | Freq: Three times a day (TID) | ORAL | 0 refills | Status: DC

## 2022-12-09 NOTE — Telephone Encounter (Signed)
Patient to have dipstick urinalysis POCT with RN Darla Lesches in clinic today and urine culture will be sent to [facility].  Patient notified and stated she gave sample to RN in clinic this am.  Orders placed for patient and RN Chantal notified via telephone.

## 2022-12-09 NOTE — Telephone Encounter (Signed)
Per RN Chantal patient with large blood, urine odorous without cloudiness or sediment ph 5 sg1.030, moderate leukocytes, negative nitrates, ketones and glucose; bilirubin small and urine dark yellow.  Left message for patient Teresa Mccormick,  your urine was concentrated increase water intake to 1 cup per hour while awake and 2 liters per 24 hours.  Notify me if headache, fever, chills, abdomen pain, back pain, vomiting or nausea  Exitcare handout on dysuria sent to my chart.  Blood in urine could be from kidney stones do you have any history of kidney stones or vaginal bleeding this week?  If feeling warm/sweaty today see clinic nurse for temperature check.  Urine sent for culture will take a couple days for those results to return from [facility]  Medications as directed. Patient is also to push fluids and may use Pyridium 200mg  po every 8 hours as needed for burning/pain #6 RF0 sent electronically to her pharmacy. Hydrate, avoid dehydration. Avoid holding urine void on frequent basis every 4 to 6 hours. If unable to void every 8 hours follow up for re-evaluation with PCM, urgent care or ER. Call or return to clinic as needed if these symptoms worsen or fail to improve as anticipated. RN Chantal notified of above also verbally as she currently does not have Epic access.

## 2022-12-10 ENCOUNTER — Other Ambulatory Visit: Payer: Self-pay | Admitting: Registered Nurse

## 2022-12-10 DIAGNOSIS — Z8639 Personal history of other endocrine, nutritional and metabolic disease: Secondary | ICD-10-CM

## 2022-12-10 NOTE — Addendum Note (Signed)
Addended by: Dreama Saa on: 12/10/2022 10:43 AM   Modules accepted: Orders

## 2022-12-11 ENCOUNTER — Encounter: Payer: Self-pay | Admitting: Registered Nurse

## 2022-12-11 MED ORDER — VITAMIN D3 25 MCG (1000 UT) PO CAPS: 1000.0000 [IU] | ORAL_CAPSULE | Freq: Every day | ORAL | 2 refills | Status: AC

## 2022-12-11 MED ORDER — PHENAZOPYRIDINE HCL 200 MG PO TABS: 200.0000 mg | ORAL_TABLET | Freq: Three times a day (TID) | ORAL | 0 refills | Status: AC

## 2022-12-11 MED ORDER — CHOLECALCIFEROL 1.25 MG (50000 UT) PO TABS: 1.0000 | ORAL_TABLET | ORAL | 0 refills | Status: AC

## 2022-12-11 NOTE — Telephone Encounter (Signed)
Spoke with patient via telephone has been taking 50,000 units vitamin D weekly since February labs did not step down to 2000 units daily in spring as previously discussed.  Notified patient new rx for daily 2000 units or vitamin d with meal daily.  Start weekly 50,000 units mid November for 12 doses then return to 2000 units daily again.  No further labs at this time.  Patient was unable to sustain level greater than 30 with 2000 units daily dose in the past.  Augmenting during the winter.  Electronic rx sent to her pharmacy start today 2000 units daily #90 RF0 then 14 Nov start 50,000 units weekly po #12 RF0 until 14 Feb then restart 2000 units po daily #90 RF2.  Patient verbalized understanding information/instructions, agreed with plan of care and had no further questions at this time.

## 2022-12-11 NOTE — Telephone Encounter (Signed)
Contacted patient via telephone and she stated symptoms improved after taking pyridium and drinking water this am had missed dose pyridium last night left bottle at work Discussed urine culture results still pending from [facility].  Pyridium refill electronic rx sent to her pharmacy of choice 200mg  po TID prn dysuria #6 RF0  Continue hydrating and notify me if any changes in symptoms prior to notification of urine culture results.  Patient agreed with plan of care and had no further questions at this time. Patient A&ox3 spoke full sentences without difficulty notified patient I am onsite at work clinic Thursday 12/12/22 and clinic closed today no RN onsite.

## 2022-12-11 NOTE — Progress Notes (Signed)
Preliminary report gram negative rods awaiting sensitivity report from [facility]

## 2022-12-11 NOTE — Telephone Encounter (Signed)
Preliminary report gram negative rods awaiting sensitivity and full report from [facility] 12/11/22

## 2022-12-12 ENCOUNTER — Telehealth: Payer: Self-pay | Admitting: Registered Nurse

## 2022-12-12 ENCOUNTER — Encounter: Payer: Self-pay | Admitting: Registered Nurse

## 2022-12-12 DIAGNOSIS — N390 Urinary tract infection, site not specified: Secondary | ICD-10-CM

## 2022-12-12 MED ORDER — NITROFURANTOIN MONOHYD MACRO 100 MG PO CAPS: 100.0000 mg | ORAL_CAPSULE | Freq: Two times a day (BID) | ORAL | 0 refills | Status: AC

## 2022-12-12 NOTE — Telephone Encounter (Signed)
Patient notified e coli grew out of urine start nitrofurantoin 100mg  po BID x 10 days #20 RF0 sent to her pharmacy of choice as already left work for the day.  Resistance noted to bactrim DS and levofloxacin.  Patient may continue pyridium and pushing fluids. Discussed should be feeling better after 24 hours on antibiotics and all symptoms resolved by time she finishes 10 day course. Patient verbalized understanding information/instructions, agreed with plan of care and had no further questions at this time.    Urine Culture, Routine (#[id number])  Test Results Flag Units Reference Interval  Urine Culture, Routine  Final report Abnormal    Result 1  Escherichia coli Cefazolin <=4 ug/mL Cefazolin with an MIC <=16 predicts susceptibility to the oral agents cefaclor, cefdinir, cefpodoxime, cefprozil, cefuroxime, cephalexin, and loracarbef when used for therapy of uncomplicated urinary tract infections due to E. coli, Klebsiella pneumoniae, and Proteus mirabilis. Greater than 100,000 colony forming units per mL Abnormal    Antimicrobial Susceptibility  ** S = Susceptible; I = Intermediate; R = Resistant **                    P = Positive; N = Negative             MICS are expressed in micrograms per mL    Antibiotic                 RSLT#1    RSLT#2    RSLT#3    RSLT#4 Amoxicillin/Clavulanic Acid    S Ampicillin                     S Cefepime                       S Ceftriaxone                    S Cefuroxime                     S Ciprofloxacin                  S Ertapenem                      S Gentamicin                     S Imipenem                       S Levofloxacin                   I Meropenem                      S Nitrofurantoin                 S Piperacillin/Tazobactam        S Tetracycline                   S Tobramycin                     S Trimethoprim/Sulfa             R

## 2022-12-13 NOTE — Telephone Encounter (Signed)
See tcon dated 12/12/22

## 2023-01-08 ENCOUNTER — Other Ambulatory Visit: Payer: Self-pay

## 2023-02-20 ENCOUNTER — Ambulatory Visit: Payer: No Typology Code available for payment source | Admitting: Registered Nurse

## 2023-12-05 ENCOUNTER — Encounter: Payer: Self-pay | Admitting: Registered Nurse

## 2023-12-05 ENCOUNTER — Telehealth: Payer: Self-pay | Admitting: Registered Nurse

## 2023-12-05 DIAGNOSIS — Z0000 Encounter for general adult medical examination without abnormal findings: Secondary | ICD-10-CM

## 2023-12-05 NOTE — Telephone Encounter (Signed)
 Clarified with patient that she has other health insurance Medicare on 11/19/23  [facility] form given to HR Jen 11/30/23 other health insurance Medicare.
